# Patient Record
Sex: Male | Born: 1955 | Race: White | Hispanic: No | Marital: Married | Smoking: Former smoker
Health system: Southern US, Community
[De-identification: ages and names within clinical notes are randomized; demographics above are authoritative.]

## PROBLEM LIST (undated history)

## (undated) DIAGNOSIS — G4733 Obstructive sleep apnea (adult) (pediatric): Secondary | ICD-10-CM

## (undated) DIAGNOSIS — K2270 Barrett's esophagus without dysplasia: Secondary | ICD-10-CM

## (undated) DIAGNOSIS — E669 Obesity, unspecified: Secondary | ICD-10-CM

## (undated) DIAGNOSIS — R202 Paresthesia of skin: Secondary | ICD-10-CM

## (undated) DIAGNOSIS — R2681 Unsteadiness on feet: Secondary | ICD-10-CM

## (undated) DIAGNOSIS — K219 Gastro-esophageal reflux disease without esophagitis: Secondary | ICD-10-CM

## (undated) DIAGNOSIS — M069 Rheumatoid arthritis, unspecified: Secondary | ICD-10-CM

## (undated) DIAGNOSIS — Z87891 Personal history of nicotine dependence: Secondary | ICD-10-CM

## (undated) DIAGNOSIS — E538 Deficiency of other specified B group vitamins: Secondary | ICD-10-CM

## (undated) DIAGNOSIS — F329 Major depressive disorder, single episode, unspecified: Secondary | ICD-10-CM

## (undated) DIAGNOSIS — M109 Gout, unspecified: Secondary | ICD-10-CM

## (undated) DIAGNOSIS — H9190 Unspecified hearing loss, unspecified ear: Secondary | ICD-10-CM

## (undated) DIAGNOSIS — F32A Depression, unspecified: Secondary | ICD-10-CM

## (undated) DIAGNOSIS — R251 Tremor, unspecified: Secondary | ICD-10-CM

## (undated) DIAGNOSIS — K760 Fatty (change of) liver, not elsewhere classified: Secondary | ICD-10-CM

## (undated) DIAGNOSIS — I10 Essential (primary) hypertension: Secondary | ICD-10-CM

## (undated) DIAGNOSIS — R200 Anesthesia of skin: Secondary | ICD-10-CM

## (undated) DIAGNOSIS — E119 Type 2 diabetes mellitus without complications: Secondary | ICD-10-CM

## (undated) DIAGNOSIS — E785 Hyperlipidemia, unspecified: Secondary | ICD-10-CM

## (undated) DIAGNOSIS — Z9889 Other specified postprocedural states: Secondary | ICD-10-CM

## (undated) DIAGNOSIS — C61 Malignant neoplasm of prostate: Secondary | ICD-10-CM

## (undated) DIAGNOSIS — R413 Other amnesia: Secondary | ICD-10-CM

## (undated) HISTORY — DX: Other specified postprocedural states: Z98.890

## (undated) HISTORY — DX: Tremor, unspecified: R25.1

## (undated) HISTORY — DX: Rheumatoid arthritis, unspecified: M06.9

## (undated) HISTORY — DX: Major depressive disorder, single episode, unspecified: F32.9

## (undated) HISTORY — DX: Unsteadiness on feet: R26.81

## (undated) HISTORY — DX: Unspecified hearing loss, unspecified ear: H91.90

## (undated) HISTORY — DX: Gout, unspecified: M10.9

## (undated) HISTORY — DX: Paresthesia of skin: R20.2

## (undated) HISTORY — DX: Deficiency of other specified B group vitamins: E53.8

## (undated) HISTORY — DX: Other amnesia: R41.3

## (undated) HISTORY — PX: KNEE SURGERY: SHX244

## (undated) HISTORY — DX: Obstructive sleep apnea (adult) (pediatric): G47.33

## (undated) HISTORY — DX: Type 2 diabetes mellitus without complications: E11.9

## (undated) HISTORY — DX: Malignant neoplasm of prostate: C61

## (undated) HISTORY — DX: Fatty (change of) liver, not elsewhere classified: K76.0

## (undated) HISTORY — DX: Anesthesia of skin: R20.0

## (undated) HISTORY — DX: Barrett's esophagus without dysplasia: K22.70

## (undated) HISTORY — DX: Depression, unspecified: F32.A

---

## 1982-12-19 HISTORY — PX: VASECTOMY: SHX75

## 1999-12-20 HISTORY — PX: UPPER GASTROINTESTINAL ENDOSCOPY: SHX188

## 2012-12-19 DIAGNOSIS — Z9889 Other specified postprocedural states: Secondary | ICD-10-CM

## 2012-12-19 HISTORY — DX: Other specified postprocedural states: Z98.890

## 2013-05-24 ENCOUNTER — Encounter: Payer: Self-pay | Admitting: Cardiovascular Disease

## 2013-05-29 ENCOUNTER — Encounter (HOSPITAL_COMMUNITY): Payer: Self-pay | Admitting: Pharmacy Technician

## 2013-05-29 ENCOUNTER — Other Ambulatory Visit: Payer: Self-pay | Admitting: *Deleted

## 2013-05-29 DIAGNOSIS — Z01818 Encounter for other preprocedural examination: Secondary | ICD-10-CM

## 2013-06-03 NOTE — H&P (Signed)
Chief Complaint: Angina Referring Physician: Dr. Hanley [name]  HPI: The patient is a 57 y/o obese Caucasian male, with a history of hypertension, HLD, and past history of tobacco abuse, who was referred by Dr. Hanley [name], for a left heart catheterization. The patient has been endorsing resting substernal chest pain for the past 3 months. He has a strong family history of CAD. His father suffered 3 MIs. His first was at the age of 74. The patient reports smoking cigars and pipes for 35 years, having quit 5 years ago. He describes his pain as a pressure/squeezing sensation, often radiating to the right arm and bilateral jaws. He notes associated shortness of breath, both resting and exertional. He endorses PND and orthopnea and often has to sleep in a chair, because he "feels smothered". He denies LEE, but notes a persistent cough for the past 3 months. His chest pain episodes often last several minutes to up to one hour. It is also brought on by emotional stress. Occasionally, he has associated nausea and diaphoresis. No vomiting. The pain has been relived with SL NTG and ASA. His last episode of pain was earlier this morning. It was left sided and pressure like, only lasting 5-10 minutes. He reportedly had a heart cath 10 years ago in [name] and it was normal. Dr. Hanley [name] performed an echocardiogram in 04/2013 that demonstrated normal systolic function. EF was 60-65%. No WMAs were noted.   Past Medical History  Diagnosis Date  . Obesity   . HTN (hypertension)   . HLD (hyperlipidemia)   . History of tobacco use   . GERD (gastroesophageal reflux disease)     No past surgical history on file.  Family History  Problem Relation Age of Onset  . Coronary artery disease Father 49    MI x 3  . Hypertension Father    Social History:  has no tobacco, alcohol, and drug history on file.  Allergies:  Allergies  Allergen Reactions  . Bee Venom Anaphylaxis    Medications Prior to Admission  Medication  Sig Dispense Refill  . aspirin EC 81 MG tablet Take 81 mg by mouth daily.      . isosorbide mononitrate (IMDUR) 30 MG 24 hr tablet Take 30 mg by mouth daily.      . simvastatin (ZOCOR) 20 MG tablet Take 20 mg by mouth every evening.        Results for orders placed during the [name] encounter of 06/04/13 (from the past 48 hour(s))  CBC     Status: None   Collection Time    06/04/13  7:48 AM      Result Value Range   WBC 5.4  4.0 - 10.5 K/uL   RBC 4.93  4.22 - 5.81 MIL/uL   Hemoglobin 15.1  13.0 - 17.0 g/dL   HCT 40.9  81.1 - 91.4 %   MCV 86.4  78.0 - 100.0 fL   MCH 30.6  26.0 - 34.0 pg   MCHC 35.4  30.0 - 36.0 g/dL   RDW 78.2  95.6 - 21.3 %   Platelets 183  150 - 400 K/uL   No results found.  Review of Systems  Constitutional: Positive for diaphoresis. Negative for fever, chills, weight loss and malaise/fatigue.  HENT: Negative for sore throat.   Eyes: Negative for blurred vision, double vision and pain.  Respiratory: Positive for cough, sputum production and shortness of breath. Negative for hemoptysis and wheezing.   Cardiovascular: Positive for chest pain, orthopnea and PND.  Negative for palpitations, claudication and leg swelling.  Gastrointestinal: Positive for nausea. Negative for vomiting, abdominal pain, diarrhea, constipation, blood in stool and melena.  Genitourinary: Negative for dysuria, urgency, frequency and hematuria.  Musculoskeletal: Positive for joint pain (jaw pain ). Negative for falls.  Skin: Negative for itching and rash.  Neurological: Positive for headaches (tension headaches and nitro induced). Negative for dizziness, focal weakness, seizures, loss of consciousness and weakness.    Blood pressure 144/80, pulse 74, temperature 97.6 F (36.4 C), temperature source Oral, resp. rate 18, height 5\' 9"  (1.753 m), weight 335 lb (151.955 kg), SpO2 93.00%. Physical Exam  Constitutional: He is oriented to person, place, and time. He appears well-developed and  well-nourished. No distress.  Obese   HENT:  Head: Normocephalic and atraumatic.  Eyes: Conjunctivae and EOM are normal. Pupils are equal, round, and reactive to light.  Neck: Normal range of motion. No JVD present. Carotid bruit is not present. No thyromegaly present.  Cardiovascular: Normal rate, regular rhythm, normal heart sounds and intact distal pulses.  Exam reveals no gallop and no friction rub.   No murmur heard. Pulses:      Radial pulses are 2+ on the right side, and 2+ on the left side.       Dorsalis pedis pulses are 2+ on the right side, and 2+ on the left side.  Respiratory: Effort normal and breath sounds normal. No respiratory distress. He has no wheezes. He has no rales. He exhibits no tenderness.  GI: Soft. Bowel sounds are normal. He exhibits no distension and no mass. There is no tenderness.  obese  Musculoskeletal: He exhibits no edema.  Lymphadenopathy:    He has no cervical adenopathy.  Neurological: He is alert and oriented to person, place, and time.  Skin: Skin is warm and dry. He is not diaphoretic.  Psychiatric: He has a normal mood and affect. His behavior is normal.     Assessment/Plan Principal Problem:   Angina at rest Active Problems:   Obesity   HLD- diet controlled, currently off statin    HTN (hypertension)   History of tobacco use - 35 year history   Family history of early CAD   GERD (gastroesophageal reflux disease)  Plan: 57 y/o male with history of obesity, HTN, HLD, past history of tobacco use and strong family history of  early CAD, presenting for a left heart catheterization, in the setting of resting chest pain, concerning for angina. Exam is benign. INR is WNL at 1.15. HR and BP stable. Plan for diagnostic cath +/- PCI and stenting, with Dr. Ally[name].  Brandon Butcher, PA-C 06/04/2013, 8:24 AM

## 2013-06-04 ENCOUNTER — Ambulatory Visit (HOSPITAL_COMMUNITY)
Admission: RE | Admit: 2013-06-04 | Discharge: 2013-06-04 | Disposition: A | Discharge status: Home / Self Care | Payer: 59 | Source: Ambulatory Visit | Attending: Cardiovascular Disease | Admitting: Cardiovascular Disease

## 2013-06-04 ENCOUNTER — Encounter (HOSPITAL_COMMUNITY)
Admission: RE | Disposition: A | Discharge status: Home / Self Care | Payer: Self-pay | Source: Ambulatory Visit | Attending: Cardiovascular Disease

## 2013-06-04 ENCOUNTER — Encounter (HOSPITAL_COMMUNITY): Payer: Self-pay | Admitting: Cardiology

## 2013-06-04 DIAGNOSIS — K219 Gastro-esophageal reflux disease without esophagitis: Secondary | ICD-10-CM | POA: Insufficient documentation

## 2013-06-04 DIAGNOSIS — R9430 Abnormal result of cardiovascular function study, unspecified: Secondary | ICD-10-CM

## 2013-06-04 DIAGNOSIS — R0989 Other specified symptoms and signs involving the circulatory and respiratory systems: Secondary | ICD-10-CM | POA: Insufficient documentation

## 2013-06-04 DIAGNOSIS — Z91038 Other insect allergy status: Secondary | ICD-10-CM | POA: Insufficient documentation

## 2013-06-04 DIAGNOSIS — I208 Other forms of angina pectoris: Secondary | ICD-10-CM

## 2013-06-04 DIAGNOSIS — R0609 Other forms of dyspnea: Secondary | ICD-10-CM | POA: Insufficient documentation

## 2013-06-04 DIAGNOSIS — E669 Obesity, unspecified: Secondary | ICD-10-CM | POA: Insufficient documentation

## 2013-06-04 DIAGNOSIS — Z87891 Personal history of nicotine dependence: Secondary | ICD-10-CM | POA: Insufficient documentation

## 2013-06-04 DIAGNOSIS — Z79899 Other long term (current) drug therapy: Secondary | ICD-10-CM | POA: Insufficient documentation

## 2013-06-04 DIAGNOSIS — Z7982 Long term (current) use of aspirin: Secondary | ICD-10-CM | POA: Insufficient documentation

## 2013-06-04 DIAGNOSIS — E785 Hyperlipidemia, unspecified: Secondary | ICD-10-CM | POA: Insufficient documentation

## 2013-06-04 DIAGNOSIS — Z01818 Encounter for other preprocedural examination: Secondary | ICD-10-CM

## 2013-06-04 DIAGNOSIS — Z6841 Body Mass Index (BMI) 40.0 and over, adult: Secondary | ICD-10-CM | POA: Insufficient documentation

## 2013-06-04 DIAGNOSIS — I10 Essential (primary) hypertension: Secondary | ICD-10-CM | POA: Insufficient documentation

## 2013-06-04 DIAGNOSIS — R0789 Other chest pain: Secondary | ICD-10-CM | POA: Insufficient documentation

## 2013-06-04 DIAGNOSIS — Z8249 Family history of ischemic heart disease and other diseases of the circulatory system: Secondary | ICD-10-CM

## 2013-06-04 HISTORY — DX: Obesity, unspecified: E66.9

## 2013-06-04 HISTORY — DX: Hyperlipidemia, unspecified: E78.5

## 2013-06-04 HISTORY — DX: Gastro-esophageal reflux disease without esophagitis: K21.9

## 2013-06-04 HISTORY — DX: Personal history of nicotine dependence: Z87.891

## 2013-06-04 HISTORY — PX: LEFT HEART CATHETERIZATION WITH CORONARY ANGIOGRAM: SHX5451

## 2013-06-04 HISTORY — DX: Essential (primary) hypertension: I10

## 2013-06-04 LAB — BASIC METABOLIC PANEL
CO2: 24 mEq/L (ref 19–32)
Chloride: 104 mEq/L (ref 96–112)
GFR calc Af Amer: >90 mL/min (ref >90)
Potassium: 3.9 mEq/L (ref 3.5–5.1)
Sodium: 139 mEq/L (ref 135–145)

## 2013-06-04 LAB — CBC
HCT: 42.6 % (ref 39.0–52.0)
Hemoglobin: 15.1 g/dL (ref 13.0–17.0)
RBC: 4.93 MIL/uL (ref 4.22–5.81)
RDW: 13.1 % (ref 11.5–15.5)
WBC: 5.4 10*3/uL (ref 4.0–10.5)

## 2013-06-04 LAB — PROTIME-INR: INR: 1.15 (ref 0.00–1.49)

## 2013-06-04 SURGERY — LEFT HEART CATHETERIZATION WITH CORONARY ANGIOGRAM
Anesthesia: Moderate Sedation

## 2013-06-04 MED ORDER — DIAZEPAM 5 MG PO TABS
5.0000 mg | ORAL_TABLET | ORAL | Status: AC
  Administered 2013-06-04: 5 mg via ORAL
  Filled 2013-06-04: qty 1

## 2013-06-04 MED ORDER — HEPARIN (PORCINE) IN NACL 2-0.9 UNIT/ML-% IJ SOLN
INTRAMUSCULAR | Status: AC
  Filled 2013-06-04: qty 1000

## 2013-06-04 MED ORDER — ASPIRIN 81 MG PO CHEW
324.0000 mg | CHEWABLE_TABLET | ORAL | Status: AC
  Administered 2013-06-04: 324 mg via ORAL
  Filled 2013-06-04: qty 4

## 2013-06-04 MED ORDER — ACETAMINOPHEN 325 MG PO TABS: 650.0000 mg | ORAL_TABLET | ORAL | Status: DC | PRN

## 2013-06-04 MED ORDER — SODIUM CHLORIDE 0.9 % IJ SOLN: 3.0000 mL | INTRAMUSCULAR | Status: DC | PRN

## 2013-06-04 MED ORDER — VERAPAMIL HCL 2.5 MG/ML IV SOLN
INTRAVENOUS | Status: AC
  Filled 2013-06-04: qty 2

## 2013-06-04 MED ORDER — SODIUM CHLORIDE 0.9 % IV SOLN
INTRAVENOUS | Status: DC
  Administered 2013-06-04: 08:00:00 via INTRAVENOUS

## 2013-06-04 MED ORDER — NITROGLYCERIN 0.2 MG/ML ON CALL CATH LAB
INTRAVENOUS | Status: AC
  Filled 2013-06-04: qty 1

## 2013-06-04 MED ORDER — HEPARIN SODIUM (PORCINE) 1000 UNIT/ML IJ SOLN
INTRAMUSCULAR | Status: AC
  Filled 2013-06-04: qty 1

## 2013-06-04 MED ORDER — ONDANSETRON HCL 4 MG/2ML IJ SOLN: 4.0000 mg | Freq: Four times a day (QID) | INTRAMUSCULAR | Status: DC | PRN

## 2013-06-04 MED ORDER — LIDOCAINE HCL (PF) 1 % IJ SOLN
INTRAMUSCULAR | Status: AC
  Filled 2013-06-04: qty 30

## 2013-06-04 MED ORDER — SODIUM CHLORIDE 0.9 % IV SOLN: INTRAVENOUS | Status: AC

## 2013-06-04 NOTE — CV Procedure (Signed)
[name] is a 56 y.o. male    [id number] LOCATION:  FACILITY: [facility]  PHYSICIAN: [name], M.D. 1956/09/16   DATE OF PROCEDURE:  06/04/2013  DATE OF DISCHARGE:   CARDIAC CATHETERIZATION     History obtained from chart review.The patient is a 57 y/o obese Caucasian male, with a history of hypertension, HLD, and past history of tobacco abuse, who was referred by [name], for a left heart catheterization. The patient has been endorsing resting substernal chest pain for the past 3 months. He has a strong family history of CAD. His father suffered 3 MIs. His first was at the age of 80. The patient reports smoking cigars and pipes for 35 years, having quit 5 years ago. He describes his pain as a pressure/squeezing sensation, often radiating to the right arm and bilateral jaws. He notes associated shortness of breath, both resting and exertional. He endorses PND and orthopnea and often has to sleep in a chair, because he "feels smothered". He denies LEE, but notes a persistent cough for the past 3 months. His chest pain episodes often last several minutes to up to one hour. It is also brought on by emotional stress. Occasionally, he has associated nausea and diaphoresis. No vomiting. The pain has been relived with SL NTG and ASA. His last episode of pain was earlier this morning. It was left sided and pressure like, only lasting 5-10 minutes. He reportedly had a heart cath 10 years ago in [facility] and it was normal. [name] performed an echocardiogram in 04/2013 that demonstrated normal systolic function. EF was 60-65%. No WMAs were noted. A Myoview stress test revealed mild inferior ischemia. Based on this cardiac catheterization was recommended.    PROCEDURE DESCRIPTION:    The patient was brought to the second floor [facility] [facility] cath lab in the postabsorptive state. He was premedicated with Valium 5 mg by mouth. His right wrist was prepped and shaved in usual sterile  fashion. Xylocaine 1% was used for local anesthesia. A 6 French sheath was inserted into the right radial  artery using standard Seldinger technique. The patient received 6000 units  of heparin  intravenously.  5 [place] TIG catheter, JR 4 catheter and pigtail catheters were used for selective coronary angiography and left ventriculography respectively. Visipaque dye was used for the entirety of the case. Retrograde aortic, left ventricular and pullback pressures were recorded.    HEMODYNAMICS:    AO SYSTOLIC/AO DIASTOLIC: 128/86   LV SYSTOLIC/LV DIASTOLIC: 133/16  ANGIOGRAPHIC RESULTS:   1. Left main; normal  2. LAD; normal 3. Left circumflex; normal.  4. Right coronary artery; dominant and normal 5. Left ventriculography; RAO left ventriculogram was performed using  25 mL of Visipaque dye at 12 mL/second. The overall LVEF estimated  60 %  Without wall motion abnormalities  IMPRESSION:[name] has normal coronary arteries and normal left ventricular function. I believe his chest pain is noncardiac and his dyspnea probably relates to his thighs and deconditioning. The sheath was removed and a TR band was placed on the right wrist to achieve patent hemostasis. The patient left the Cath Lab in stable condition. He'll be hydrated for 2 hours and then discharged home. Dr. Andris [name] was notified of these results.  Brandon Gess MD, [facility] 06/04/2013 9:57 AM

## 2013-06-04 NOTE — H&P (Signed)
.    Pt was reexamined and existing H & P reviewed. No changes found.  [name], MD [facility] 06/04/2013 9:21 AM

## 2013-06-04 NOTE — Progress Notes (Addendum)
Radial cath discharge & medication discharge instructions given to patient and spouse.  Both voice understanding.   Positive reverse allen's test right radial

## 2014-11-27 ENCOUNTER — Encounter (HOSPITAL_COMMUNITY): Payer: Self-pay | Admitting: Cardiovascular Disease

## 2016-03-02 DIAGNOSIS — E1140 Type 2 diabetes mellitus with diabetic neuropathy, unspecified: Secondary | ICD-10-CM | POA: Insufficient documentation

## 2016-03-02 DIAGNOSIS — F329 Major depressive disorder, single episode, unspecified: Secondary | ICD-10-CM | POA: Insufficient documentation

## 2016-03-02 DIAGNOSIS — Z91199 Patient's noncompliance with other medical treatment and regimen due to unspecified reason: Secondary | ICD-10-CM | POA: Insufficient documentation

## 2016-03-02 DIAGNOSIS — E7800 Pure hypercholesterolemia, unspecified: Secondary | ICD-10-CM | POA: Insufficient documentation

## 2016-03-02 DIAGNOSIS — I10 Essential (primary) hypertension: Secondary | ICD-10-CM | POA: Insufficient documentation

## 2016-03-02 DIAGNOSIS — M069 Rheumatoid arthritis, unspecified: Secondary | ICD-10-CM | POA: Insufficient documentation

## 2016-08-17 DIAGNOSIS — R1011 Right upper quadrant pain: Secondary | ICD-10-CM | POA: Insufficient documentation

## 2017-09-13 DIAGNOSIS — N401 Enlarged prostate with lower urinary tract symptoms: Secondary | ICD-10-CM | POA: Insufficient documentation

## 2017-09-13 DIAGNOSIS — G4733 Obstructive sleep apnea (adult) (pediatric): Secondary | ICD-10-CM | POA: Insufficient documentation

## 2018-03-27 DIAGNOSIS — N138 Other obstructive and reflux uropathy: Secondary | ICD-10-CM | POA: Diagnosis not present

## 2018-03-27 DIAGNOSIS — N401 Enlarged prostate with lower urinary tract symptoms: Secondary | ICD-10-CM | POA: Diagnosis not present

## 2018-03-27 DIAGNOSIS — R358 Other polyuria: Secondary | ICD-10-CM | POA: Diagnosis not present

## 2018-04-24 DIAGNOSIS — N401 Enlarged prostate with lower urinary tract symptoms: Secondary | ICD-10-CM | POA: Diagnosis not present

## 2018-04-24 DIAGNOSIS — N138 Other obstructive and reflux uropathy: Secondary | ICD-10-CM | POA: Diagnosis not present

## 2018-05-08 DIAGNOSIS — N138 Other obstructive and reflux uropathy: Secondary | ICD-10-CM | POA: Diagnosis not present

## 2018-05-08 DIAGNOSIS — N401 Enlarged prostate with lower urinary tract symptoms: Secondary | ICD-10-CM | POA: Diagnosis not present

## 2018-05-17 DIAGNOSIS — N138 Other obstructive and reflux uropathy: Secondary | ICD-10-CM | POA: Diagnosis not present

## 2018-05-17 DIAGNOSIS — N401 Enlarged prostate with lower urinary tract symptoms: Secondary | ICD-10-CM | POA: Diagnosis not present

## 2018-05-21 DIAGNOSIS — N400 Enlarged prostate without lower urinary tract symptoms: Secondary | ICD-10-CM | POA: Diagnosis not present

## 2018-05-21 DIAGNOSIS — Z01818 Encounter for other preprocedural examination: Secondary | ICD-10-CM | POA: Diagnosis not present

## 2018-05-23 DIAGNOSIS — Z8042 Family history of malignant neoplasm of prostate: Secondary | ICD-10-CM | POA: Diagnosis not present

## 2018-05-23 DIAGNOSIS — C61 Malignant neoplasm of prostate: Secondary | ICD-10-CM | POA: Diagnosis not present

## 2018-05-23 DIAGNOSIS — N401 Enlarged prostate with lower urinary tract symptoms: Secondary | ICD-10-CM | POA: Diagnosis not present

## 2018-05-23 DIAGNOSIS — N138 Other obstructive and reflux uropathy: Secondary | ICD-10-CM | POA: Diagnosis not present

## 2018-05-23 DIAGNOSIS — R319 Hematuria, unspecified: Secondary | ICD-10-CM | POA: Diagnosis not present

## 2018-05-23 HISTORY — PX: TRANSURETHRAL RESECTION OF PROSTATE: SHX73

## 2018-05-24 DIAGNOSIS — N401 Enlarged prostate with lower urinary tract symptoms: Secondary | ICD-10-CM | POA: Diagnosis not present

## 2018-05-24 DIAGNOSIS — N138 Other obstructive and reflux uropathy: Secondary | ICD-10-CM | POA: Diagnosis not present

## 2018-05-24 DIAGNOSIS — Z8042 Family history of malignant neoplasm of prostate: Secondary | ICD-10-CM | POA: Diagnosis not present

## 2018-05-25 DIAGNOSIS — N401 Enlarged prostate with lower urinary tract symptoms: Secondary | ICD-10-CM | POA: Diagnosis not present

## 2018-05-25 DIAGNOSIS — N138 Other obstructive and reflux uropathy: Secondary | ICD-10-CM | POA: Diagnosis not present

## 2018-05-29 DIAGNOSIS — C61 Malignant neoplasm of prostate: Secondary | ICD-10-CM | POA: Diagnosis not present

## 2018-06-19 DIAGNOSIS — C61 Malignant neoplasm of prostate: Secondary | ICD-10-CM | POA: Diagnosis not present

## 2018-08-08 DIAGNOSIS — R319 Hematuria, unspecified: Secondary | ICD-10-CM | POA: Diagnosis not present

## 2018-08-08 DIAGNOSIS — N390 Urinary tract infection, site not specified: Secondary | ICD-10-CM | POA: Diagnosis not present

## 2018-08-08 DIAGNOSIS — R300 Dysuria: Secondary | ICD-10-CM | POA: Diagnosis not present

## 2018-09-03 DIAGNOSIS — R300 Dysuria: Secondary | ICD-10-CM | POA: Diagnosis not present

## 2018-09-03 DIAGNOSIS — C61 Malignant neoplasm of prostate: Secondary | ICD-10-CM | POA: Diagnosis not present

## 2018-09-03 DIAGNOSIS — R339 Retention of urine, unspecified: Secondary | ICD-10-CM | POA: Diagnosis not present

## 2018-09-13 DIAGNOSIS — C61 Malignant neoplasm of prostate: Secondary | ICD-10-CM | POA: Diagnosis not present

## 2018-09-17 DIAGNOSIS — E700 Classical phenylketonuria: Secondary | ICD-10-CM | POA: Diagnosis not present

## 2018-09-17 DIAGNOSIS — R351 Nocturia: Secondary | ICD-10-CM | POA: Diagnosis not present

## 2018-09-17 DIAGNOSIS — N401 Enlarged prostate with lower urinary tract symptoms: Secondary | ICD-10-CM | POA: Diagnosis not present

## 2018-09-17 DIAGNOSIS — E1100 Type 2 diabetes mellitus with hyperosmolarity without nonketotic hyperglycemic-hyperosmolar coma (NKHHC): Secondary | ICD-10-CM | POA: Diagnosis not present

## 2018-09-17 DIAGNOSIS — I10 Essential (primary) hypertension: Secondary | ICD-10-CM | POA: Diagnosis not present

## 2018-09-19 DIAGNOSIS — C61 Malignant neoplasm of prostate: Secondary | ICD-10-CM | POA: Diagnosis not present

## 2018-09-20 DIAGNOSIS — E1100 Type 2 diabetes mellitus with hyperosmolarity without nonketotic hyperglycemic-hyperosmolar coma (NKHHC): Secondary | ICD-10-CM | POA: Diagnosis not present

## 2018-09-20 DIAGNOSIS — R1011 Right upper quadrant pain: Secondary | ICD-10-CM | POA: Diagnosis not present

## 2018-09-20 DIAGNOSIS — Z0001 Encounter for general adult medical examination with abnormal findings: Secondary | ICD-10-CM | POA: Diagnosis not present

## 2018-09-20 DIAGNOSIS — I10 Essential (primary) hypertension: Secondary | ICD-10-CM | POA: Diagnosis not present

## 2018-09-20 DIAGNOSIS — E7800 Pure hypercholesterolemia, unspecified: Secondary | ICD-10-CM | POA: Diagnosis not present

## 2018-10-03 DIAGNOSIS — K8020 Calculus of gallbladder without cholecystitis without obstruction: Secondary | ICD-10-CM | POA: Diagnosis not present

## 2018-10-03 DIAGNOSIS — R1011 Right upper quadrant pain: Secondary | ICD-10-CM | POA: Diagnosis not present

## 2018-10-03 DIAGNOSIS — K760 Fatty (change of) liver, not elsewhere classified: Secondary | ICD-10-CM | POA: Diagnosis not present

## 2018-11-27 DIAGNOSIS — R413 Other amnesia: Secondary | ICD-10-CM | POA: Diagnosis not present

## 2018-11-27 DIAGNOSIS — R2681 Unsteadiness on feet: Secondary | ICD-10-CM | POA: Insufficient documentation

## 2018-11-27 DIAGNOSIS — R202 Paresthesia of skin: Secondary | ICD-10-CM | POA: Insufficient documentation

## 2018-11-27 DIAGNOSIS — G250 Essential tremor: Secondary | ICD-10-CM | POA: Diagnosis not present

## 2018-11-28 DIAGNOSIS — E538 Deficiency of other specified B group vitamins: Secondary | ICD-10-CM | POA: Insufficient documentation

## 2018-12-02 DIAGNOSIS — R2681 Unsteadiness on feet: Secondary | ICD-10-CM | POA: Diagnosis not present

## 2018-12-02 DIAGNOSIS — R413 Other amnesia: Secondary | ICD-10-CM | POA: Diagnosis not present

## 2018-12-03 DIAGNOSIS — C61 Malignant neoplasm of prostate: Secondary | ICD-10-CM | POA: Diagnosis not present

## 2018-12-05 DIAGNOSIS — C61 Malignant neoplasm of prostate: Secondary | ICD-10-CM | POA: Diagnosis not present

## 2018-12-07 DIAGNOSIS — E1100 Type 2 diabetes mellitus with hyperosmolarity without nonketotic hyperglycemic-hyperosmolar coma (NKHHC): Secondary | ICD-10-CM | POA: Diagnosis not present

## 2018-12-07 LAB — HEMOGLOBIN A1C: Hemoglobin A1C: 8.1

## 2018-12-07 LAB — BASIC METABOLIC PANEL
BUN: 16 (ref 4–21)
Creatinine: 0.9 (ref 0.6–1.3)
GLUCOSE: 243
Potassium: 4.2 (ref 3.4–5.3)
Sodium: 136 — AB (ref 137–147)

## 2018-12-07 LAB — HEPATIC FUNCTION PANEL
ALT: 22 (ref 10–40)
AST: 16 (ref 14–40)
Alkaline Phosphatase: 70 (ref 25–125)
BILIRUBIN, TOTAL: 0.6

## 2018-12-07 LAB — VITAMIN B12: Vitamin B-12: 196

## 2018-12-10 DIAGNOSIS — E1100 Type 2 diabetes mellitus with hyperosmolarity without nonketotic hyperglycemic-hyperosmolar coma (NKHHC): Secondary | ICD-10-CM | POA: Diagnosis not present

## 2018-12-10 DIAGNOSIS — I10 Essential (primary) hypertension: Secondary | ICD-10-CM | POA: Diagnosis not present

## 2018-12-10 DIAGNOSIS — Z20828 Contact with and (suspected) exposure to other viral communicable diseases: Secondary | ICD-10-CM | POA: Diagnosis not present

## 2019-01-01 DIAGNOSIS — R200 Anesthesia of skin: Secondary | ICD-10-CM | POA: Diagnosis not present

## 2019-01-01 DIAGNOSIS — R413 Other amnesia: Secondary | ICD-10-CM | POA: Diagnosis not present

## 2019-01-01 DIAGNOSIS — E538 Deficiency of other specified B group vitamins: Secondary | ICD-10-CM | POA: Diagnosis not present

## 2019-01-01 DIAGNOSIS — G250 Essential tremor: Secondary | ICD-10-CM | POA: Diagnosis not present

## 2019-01-09 DIAGNOSIS — J0100 Acute maxillary sinusitis, unspecified: Secondary | ICD-10-CM | POA: Diagnosis not present

## 2019-01-09 DIAGNOSIS — Z87891 Personal history of nicotine dependence: Secondary | ICD-10-CM | POA: Diagnosis not present

## 2019-01-28 ENCOUNTER — Encounter: Payer: Self-pay | Admitting: Internal Medicine

## 2019-01-28 ENCOUNTER — Ambulatory Visit: Payer: BLUE CROSS/BLUE SHIELD | Admitting: Internal Medicine

## 2019-01-28 VITALS — BP 120/78 | HR 81 | Temp 98.6°F | Ht 68.75 in | Wt 335.0 lb

## 2019-01-28 DIAGNOSIS — E782 Mixed hyperlipidemia: Secondary | ICD-10-CM

## 2019-01-28 DIAGNOSIS — R413 Other amnesia: Secondary | ICD-10-CM

## 2019-01-28 DIAGNOSIS — R440 Auditory hallucinations: Secondary | ICD-10-CM

## 2019-01-28 DIAGNOSIS — R296 Repeated falls: Secondary | ICD-10-CM | POA: Diagnosis not present

## 2019-01-28 DIAGNOSIS — K2100 Gastro-esophageal reflux disease with esophagitis, without bleeding: Secondary | ICD-10-CM

## 2019-01-28 DIAGNOSIS — E1140 Type 2 diabetes mellitus with diabetic neuropathy, unspecified: Secondary | ICD-10-CM

## 2019-01-28 DIAGNOSIS — K22719 Barrett's esophagus with dysplasia, unspecified: Secondary | ICD-10-CM

## 2019-01-28 DIAGNOSIS — R251 Tremor, unspecified: Secondary | ICD-10-CM

## 2019-01-28 DIAGNOSIS — F321 Major depressive disorder, single episode, moderate: Secondary | ICD-10-CM | POA: Diagnosis not present

## 2019-01-28 DIAGNOSIS — H833X3 Noise effects on inner ear, bilateral: Secondary | ICD-10-CM

## 2019-01-28 DIAGNOSIS — I10 Essential (primary) hypertension: Secondary | ICD-10-CM

## 2019-01-28 MED ORDER — BUPROPION HCL ER (XL) 150 MG PO TB24: 150.0000 mg | ORAL_TABLET | Freq: Every day | ORAL | 3 refills | Status: DC

## 2019-01-28 NOTE — Progress Notes (Signed)
Provider:  Rexene Edison. Mariea Clonts, D.O., C.M.D. Location:   [facility]   Place of Service:   clinic  Previous PCP: Guadlupe Spanish, MD Patient Care Team: Guadlupe Spanish, MD as PCP - General (Internal Medicine)  Extended Emergency Contact Information Primary Emergency Contact: Laningham,Sharon Address: [address]          [place], [place] [address] Johnnette Litter of [place] Phone: [phone] Mobile Phone: [phone] Relation: Spouse  Goals of Care: Advanced Directive information Advanced Directives 01/28/2019  Does Patient Have a Medical Advance Directive? Yes  Does patient want to make changes to medical advance directive? No - Patient declined  Pre-existing out of facility DNR order (yellow form or pink MOST form) -  Do not have HCPOA in writing.  Chief Complaint  Patient presents with  . Establish Care    new patient    HPI: Patient is a 63 y.o. male seen today to establish with [facility].  Records in care everywhere.  Labs to be abstracted out of care everywhere.    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  Gained weight.  His wife says probably 12 years.  Normal today at 120/78.  Has been at goal the last 7 appts or so.    Diabetes:  Diagnosed with it in 2015.  Had been wellcontrolled with metformin.  trulicity then helped.  Metformin reduced when he had gi upset, but increased again.  Also on glipizide.  He doesn't check his sugar.  His wife reports he's noncompliant with checking.  It runs in the 130s typically.  Hba1c was 8.1 in December.  He gets sleepy if sugar is high.   Overall, he sleeps quite a bit.  He does ot have energy or enthusiasm to do much.  He ays he used to read a lot.  Has a [employer] of books.  Used to watch movies.  Has pulled out of civic clubs.  Doesn't even watch tv anymore.  Likes IT consultant.  He's become socially withdrawn over the past two years.  He used to do a lot of public speaking.  It's become hard to pull  words.  Used to be able to use words people can't spell.  He's having to read things twice to remember.  Memory is giving him problems.  Says he'll get misdirected.  His wife does most of the dirivng.  He feels like he's not analytical much anymore.  What bothers him the most in the past year is lack of interest in things he used to enjoy.  He likes being home with his three dogs.  He enjoyed teaching grandsons things in years passed.  Says it's probably his memory that bothers him the most.  He watches the news 24x7.    He has hearing aids--bad from shooting guns his whole life.  Sometimes just does not want to be around people and make conversation.  Has friends but doesn't want to reach out to them.    Misses work, but not enough to want to go back.  Worked for [employer] for a few years.  Money is now moved by wire.    Used to like working in his yard.  Does not get the enjoyment he had out of this anymore.  Many friends have died.  Has lost connections there.  Goes to bed at 7:30pm and sleeps until 8am the next morning.  Remembers things really well from childhood.  Dreams a lot.  Usually dreams about being at work.  All of those in his dreams are deceased.  Sometimes dreams about being a kid again.  Can name names from the [place] from 40 yrs ago.    Gets a headache in either temple often.  Takes goody's powder for it.  It feels like lightning bolts.  He quit going to church.  He questions things a lot.  He looks up at the stars to see if the cathedral lights are on.  He's fed up with the evilness of the world.  Sometimes thinks the world needs an asteroid to hit it and kill off all the people and keep the animals.  He talks about death a lot.  He is not afraid of death.  Has seen suicides, car collisions, etec.  The pain people suffer duirng a transiition from life to death he does not want to go through.  Says when he was diagnosed with cancer, his attitude changed to  good bad it's just the same. He says he has never thought of suicide.  He wants to live.  He wants to see Jarrett Soho hopefully have a granddaughter.  His religious convictions also prevent this.  Says those who kill themselves are selfish and he would not have those thoughts.  He says he was put on an SSRI when their grandson was living with them.  That was when his depression peaked.  He thinks the celexa helped him some.  It was started in 2017 or 2018.  It was increased last year.    Prostate cancer--dxed in June--had TURP with biopsy:  His dad died of prostate cancer with bone mets.  He remembers what he went through which was traumatic.  It felt like a death sentence.  He thought he'd go out with a heart attack not that.  His enlarged prostate was closing on the urethra.  Gleason 6 prostate ca.  Radiation or wait and see were choices.   Has tremors pretty bad at times, more at night.  It's getting worse when he's eating making it challenging to get his food from his plate to his lips.    He has fallen numerous times and his wife and daughter bother notice he shuffles his feet.  They've tried to help with this by purchasing new shoewear which has made him sometimes pick up his feet a bit better.      MRI showed "multiple vascular strokes on it" was what they were told though it showed chronic ischemic disease.      He hears voices of dead.  He saw a face on a plate that he thought was talking to him.  Later in the discussion, his wife mentioned that he sometimes will see people out of the corner of his eye passing by him.  He also hears the voices of people, but she wonders if it isn't that he isn't clearly hearing actual noises.    He's paid a couple of bills twice.  He has forgetten his medications of double takes it.  His wife has been giving him a day of the week pillbox.      He is no longer so authoritative.  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    57/32 in Jan 28/30 prior to that  in Sept.  Past Medical History:  Diagnosis Date  . B12 deficiency    Per New Patient Packet   . Barrett esophagus    Per New Patient Packet   .  Depression    Per New Patient Packet   . Diabetes [facility])    Per New Patient Packet   . Fatty liver    Per New Patient Packet   . GERD (gastroesophageal reflux disease)   . Gout    Per New Patient Packet   . History of cardiac cath 2014   Dr.Berry, Per New Patient Packet   . History of esophagogastroduodenoscopy (EGD)    Per New Patient Packet   . History of tobacco use   . HLD (hyperlipidemia)   . HOH (hard of hearing)    Per New Patient Packet   . HTN (hypertension)   . Memory loss    Per New Patient Packet   . Numbness and tingling of both feet    Per New Patient Packet   . Obesity   . OSA (obstructive sleep apnea)    Per New Patient Packet, No CPAP  . Prostate cancer [facility])    Per New Patient Packet   . Rheumatoid arthritis [facility])    Per New Patient Packet   . Tremor    Per New Patient Packet   . Unsteady gait    Per New Patient Packet    Past Surgical History:  Procedure Laterality Date  . KNEE SURGERY Right    Per New Patient Packet   . LEFT HEART CATHETERIZATION WITH CORONARY ANGIOGRAM N/A 06/04/2013   Procedure: LEFT HEART CATHETERIZATION WITH CORONARY ANGIOGRAM;  Surgeon: Lorretta Harp, MD;  Location: [facility] CATH LAB;  Service: Cardiovascular;  Laterality: N/A;  . TRANSURETHRAL RESECTION OF PROSTATE  05/23/2018   Per New Patient Packet, [place]   . UPPER GASTROINTESTINAL ENDOSCOPY  2001   Per New Patient Packet   . VASECTOMY  1984   Per New Patient Packet     Social History   Socioeconomic History  . Marital status: Married    Spouse name: Not on file  . Number of children: Not on file  . Years of education: Not on file  . Highest education level: Not on file  Occupational History  . Not on file  Social Needs  . Financial resource strain: Not on file  . Food insecurity:    Worry: Not on file     Inability: Not on file  . Transportation needs:    Medical: Not on file    Non-medical: Not on file  Tobacco Use  . Smoking status: Former Smoker    Types: Cigarettes, Pipe    Last attempt to quit: 12/19/2013    Years since quitting: 5.1  . Smokeless tobacco: Never Used  Substance and Sexual Activity  . Alcohol use: Yes    Comment: Occasional   . Drug use: Never  . Sexual activity: Not on file  Lifestyle  . Physical activity:    Days per week: Not on file    Minutes per session: Not on file  . Stress: Not on file  Relationships  . Social connections:    Talks on phone: Not on file    Gets together: Not on file    Attends religious service: Not on file    Active member of club or organization: Not on file    Attends meetings of clubs or organizations: Not on file    Relationship status: Not on file  Other Topics Concern  . 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 No, putting on socks are difficult   Do you have difficulty preparing food or eating? No   Do you have difficulty managing your medications? Yes   Do you have difficulty managing your finances? Yes, recently    Do you have difficulty affording your medications? No    reports that he quit smoking about 5 years ago. His smoking use included cigarettes and pipe. He has never used smokeless tobacco. He reports current alcohol use. He reports that he does not use drugs.  Functional Status Survey:    Family History  Problem Relation Age of Onset  . Coronary artery  disease Father 17       MI x 3  . Hypertension Father   . Prostate cancer Father   . Hypertension Mother   . Dementia Mother   . Rheum arthritis Mother   . Atrial fibrillation Mother   . Hypothyroidism Mother   . Anxiety disorder Mother   . Depression Mother   . Hepatitis C Mother   . Hypertension Brother   . High Cholesterol Brother   . Alcohol abuse Brother   . Obesity Brother   . Anxiety disorder Daughter   . Conductive hearing loss Daughter   . High Cholesterol Daughter   . Vitamin D deficiency Daughter   . Bipolar disorder Daughter   . Depression Daughter   . Obesity Daughter     Health Maintenance  Topic Date Due  . Hepatitis C Screening  1956/10/26  . HIV Screening  12/15/1971  . COLONOSCOPY  12/14/2006  . INFLUENZA VACCINE  07/19/2018  . TETANUS/TDAP  09/18/2024    Allergies  Allergen Reactions  . Bee Venom Anaphylaxis    Outpatient Encounter Medications as of 01/28/2019  Medication Sig  . allopurinol (ZYLOPRIM) 300 MG tablet Take 300 mg by mouth daily.  . Aspirin-Acetaminophen-Caffeine (GOODYS EXTRA STRENGTH) [phone] MG PACK Take by mouth as needed.  Marland Kitchen atorvastatin (LIPITOR) 40 MG tablet Take 40 mg by mouth daily.  Marland Kitchen dexlansoprazole (DEXILANT) 60 MG capsule Take 60 mg by mouth daily as needed.  . Dulaglutide (TRULICITY) 1.5 WC/5.8NI SOPN Inject 1.5 mg into the skin every 7 (seven) days.  Marland Kitchen glipiZIDE (GLUCOTROL XL) 5 MG 24 hr tablet Take 5 mg by mouth daily with breakfast.  . lisinopril (PRINIVIL,ZESTRIL) 20 MG tablet Take 20 mg by mouth daily.  . metFORMIN (GLUCOPHAGE) 1000 MG tablet Take 1,000 mg by mouth 2 (two) times daily with a meal.  . nitroGLYCERIN (NITROSTAT) 0.4 MG SL tablet Place 0.4 mg under the tongue every 5 (five) minutes as needed for chest pain.  . pantoprazole (PROTONIX) 40 MG tablet Take 40 mg by mouth daily as needed.  . Turmeric Curcumin 500 MG CAPS Take by mouth daily.  . vitamin B-12 (CYANOCOBALAMIN) 1000 MCG tablet Take 1,000 mcg  by mouth every other day.  . [DISCONTINUED] citalopram (CELEXA) 40 MG tablet Take 40 mg by mouth daily.  Marland Kitchen buPROPion (WELLBUTRIN XL) 150 MG 24 hr  tablet Take 1 tablet (150 mg total) by mouth daily.  . [DISCONTINUED] Aspirin-Acetaminophen-Caffeine [phone] MG PACK Take by mouth as needed.   No facility-administered encounter medications on file as of 01/28/2019.     Review of Systems  Constitutional: Positive for malaise/fatigue. Negative for chills, fever and weight loss.       Sits around a lot, does not do what he used to enjoy  HENT: Positive for hearing loss. Negative for congestion and ear pain.   Eyes: Negative for blurred vision and double vision.       Glasses, had recent eye checkup  Respiratory: Negative for cough and shortness of breath.   Cardiovascular: Negative for chest pain, palpitations, orthopnea, claudication, leg swelling and PND.  Gastrointestinal: Positive for heartburn. Negative for abdominal pain, blood in stool, constipation, diarrhea, melena, nausea and vomiting.       Barrett's esophagus  Genitourinary: Negative for dysuria.       Has had some difficulty with leakage since his prostate ca txs  Musculoskeletal: Positive for falls and joint pain. Negative for back pain, myalgias and neck pain.       RA diagnosis by bloodwork per his daughter--gets inflammation of mcp joints  Skin: Negative for itching and rash.  Neurological: Positive for tingling, sensory change and weakness. Negative for dizziness, focal weakness, seizures and loss of consciousness.       Speaks slower and harder to find words--was eloquent speaker and leader in the past per pt himself and his family  Endo/Heme/Allergies: Does not bruise/bleed easily.  Psychiatric/Behavioral: Positive for depression, hallucinations and memory loss. Negative for substance abuse and suicidal ideas. The patient is not nervous/anxious and does not have insomnia.     Vitals:   01/28/19 1520  BP: 120/78  Pulse:  81  Temp: 98.6 F (37 C)  TempSrc: Oral  SpO2: 96%  Weight: (!) 335 lb (152 kg)  Height: 5' 8.75" (1.746 m)   Body mass index is 49.83 kg/m. Physical Exam Vitals signs reviewed.  Constitutional:      General: He is not in acute distress.    Appearance: Normal appearance. He is obese. He is not ill-appearing or toxic-appearing.  HENT:     Head: Normocephalic and atraumatic.     Right Ear: Tympanic membrane, ear canal and external ear normal.     Left Ear: Tympanic membrane, ear canal and external ear normal.     Nose: Nose normal. No rhinorrhea.     Mouth/Throat:     Mouth: Mucous membranes are moist.     Pharynx: Oropharynx is clear. No oropharyngeal exudate.  Eyes:     General: No scleral icterus.    Extraocular Movements: Extraocular movements intact.     Pupils: Pupils are equal, round, and reactive to light.     Comments: glasses  Cardiovascular:     Rate and Rhythm: Normal rate and regular rhythm.     Pulses: Normal pulses.     Heart sounds: Normal heart sounds.  Pulmonary:     Effort: Pulmonary effort is normal.     Breath sounds: Normal breath sounds. No wheezing, rhonchi or rales.  Abdominal:     General: Bowel sounds are normal. There is no distension.     Palpations: Abdomen is soft. There is no mass.     Tenderness: There is no abdominal tenderness. There is no right CVA tenderness, left CVA tenderness, guarding or rebound.  Musculoskeletal: Normal range of motion.     Right lower leg:  No edema.     Left lower leg: No edema.     Comments: Was lifting his feet while walking during my assessment (but does not at home); using cane; no deformity or active swelling of mcp joints of hands  Skin:    General: Skin is warm and dry.     Capillary Refill: Capillary refill takes less than 2 seconds.  Neurological:     General: No focal deficit present.     Mental Status: He is alert and oriented to person, place, and time.     Cranial Nerves: No cranial nerve deficit.       Sensory: Sensory deficit present.     Motor: Weakness present.     Coordination: Coordination normal.     Gait: Gait abnormal.     Deep Tendon Reflexes: Reflexes normal.     Comments: But does have some short term memory loss--repeated questions and stories a little bit--fixated on prognosis; bilateral essential tremor--pt can stop with holding onto his cane or putting his hand in his lap  Psychiatric:     Comments: Visibly upset when discussing the changes he's aware of in his life; seems down, affect a bit flat though he tries to joke around    Labs reviewed:  Pulled from careeverywhere Basic Metabolic Panel: Recent Labs    12/07/18  NA 136*  K 4.2  BUN 16  CREATININE 0.9   Liver Function Tests: Recent Labs    12/07/18  AST 16  ALT 22  ALKPHOS 70   No results for input(s): LIPASE, AMYLASE in the last 8760 hours. No results for input(s): AMMONIA in the last 8760 hours. CBC: No results for input(s): WBC, NEUTROABS, HGB, HCT, MCV, PLT in the last 8760 hours. Cardiac Enzymes: No results for input(s): CKTOTAL, CKMB, CKMBINDEX, TROPONINI in the last 8760 hours. BNP: Invalid input(s): POCBNP Lab Results  Component Value Date   HGBA1C 8.1 12/07/2018   No results found for: TSH Lab Results  Component Value Date   VITAMINB12 196 12/07/2018   No results found for: FOLATE No results found for: IRON, TIBC, FERRITIN  Imaging and Procedures noted on new patient packet: 12/03/18 MRI brain at [facility]:  Abnormal MRI of the Brain without contrast. 1. Mild-moderate degree of chronic microvascular ischemic changes of the supratentorial white matter tracts and left cerebellar hemisphere. No signs of acute ischemia or hemorrhagic components. 2. Large left maxillary mucous retention cyst. Scattered mucus inflammatory changes of ethmoid and sphenoid sinuses. 3. Dolichoectasia of the intracranial vessels. 4. Mild diffuse cortical atrophy.  [place] RUQ 10/03/18:  1. Small gallstones  and near the neck of the gallbladder. No pain over the gallbladder with compression. 2. Hepatic steatosis.  Retrograde urography/pyelogram 05/23/18 for hematuria:  Mild fullness right intrarenal collecting system. No filling defect in either intrarenal collecting system. No ureteral abnormality evident.  Assessment/Plan 1. Depression, major, single episode, moderate (HCC) - will taper off celexa and start wellbutrin right away--instructions provided in avs - buPROPion (WELLBUTRIN XL) 150 MG 24 hr tablet; Take 1 tablet (150 mg total) by mouth daily.  Dispense: 30 tablet; Refill: 3  2. Memory loss -I want to be sure that depression is not the cause of his declining cognition and hallucinations (?pseudodementia)  b/c there certainly does seem to be a component of this -he may need a full battery of neuropsych testing if we do not notice an overt change with treatment with wellbutrin -I am concerned this may be Lewy Body Dementia  given the falls/tremor/hallucinations and memory loss which have been fairly rapid as well as his family history -will update mmse next visit and do formal PHQ-9 on him also  -also advised to increase B12 supplement to 1053mcg daily to replete low B12 to goal over 500   3. Tremor -does look essential not resting/PD ish so could be confounding here  4. Falls frequently -would like him to get a formal PT eval but did not want to push the issue on our first meeting when he lacks much motivation to do things--also transportation will be a challenge  5. Type 2 diabetes mellitus with diabetic neuropathy, without long-term current use of insulin (HCC) -control should be better which would help with secondary prevention for further vascular damage Lab Results  Component Value Date   HGBA1C 8.1 12/07/2018  -cont lipitor, trulicity, glipizide, metformin -has great appetite, but makes poor choices -educated some on diet--he is aware of what he should eat, but prefers  unhealthy red meats and potatoes and does not eat many healthy veggies  6. Mixed hyperlipidemia -cont statin therapy--had lipids also with other labs in Dec -goal LDL <70 and his was 35; TG were still high at 163  7. Barrett's esophagus with dysplasia -notable.   -cont PPI, work on diet, conservative measure with elevation of HOB and avoiding culprit foods near bed -GI monitoring  8. Morbid obesity ([place]) -ongoing, he knows weight loss would help him -improve diet, also try to work on improving mobility with therapy next time -of note, he also has obstructive sleep apnea that is not treated, discussed need for sleep study to be done and CPAP use--he does snore  9. Essential hypertension -bp is well controlled, cont the same regimen here  10. Gastroesophageal reflux disease with esophagitis -as in Barrett's, cont PPI  11. Auditory hallucinations -typically Lewy Body will have visual hallucinations and auditory tend to be more with psychiatric disease like depression with psychosis (possibility) or schizophrenia (does not develop in seniors) -tx depression and see what happens here -has had some visual, as well, which is concerning  12. Noise-induced hearing loss of both ears -really needs this managed better b/c it could also be affecting his auditory hallucinations--recommend audiology f/u, but he needs some motivation first to do these things   Labs/tests ordered:   Orders Placed This Encounter  Procedures  . Hemoglobin A1c    This external order was created through the Results Console.  . Basic metabolic panel    This external order was created through the Results Console.  . Vitamin B12    This external order was created through the Results Console.  . Hepatic function panel    This external order was created through the Results Console.   He declined pneumonia, shingles and flu shots.  Rudy Luhmann L. [place], D.O. [facility] [address], [place] [address] Cell Phone (Mon-Fri 8am-5pm):  [phone] On Call:  [phone] & follow prompts after 5pm & weekends Office Phone:  [phone] Office Fax:  [phone]

## 2019-01-28 NOTE — Patient Instructions (Addendum)
Decrease your celexa to 20mg  daily for one week, then discontinue.  Increase B12 to 1099mcg daily to get that level up to normal.    Begin wellbutrin XL 150mg  daily for depression.  Continue to work on improving your diet--fewer starchy carbohydrates like potatoes, rice, bread.  More leafy greens and other healthy vegetables.  This will improve your sugar and cholesterol.

## 2019-02-11 DIAGNOSIS — G4733 Obstructive sleep apnea (adult) (pediatric): Secondary | ICD-10-CM | POA: Diagnosis not present

## 2019-02-21 ENCOUNTER — Telehealth: Payer: Self-pay | Admitting: Internal Medicine

## 2019-02-21 NOTE — Telephone Encounter (Signed)
Started wellbutrin after ov on 01/28/2019, within a 2 wks pt has started having vivid dreams that have interrupted his sleep to the point that he has so much anxiety through out the day.   Daughter(Brandon [name]) & wife Brandon [name]) spend time consoling Brandon [name] that these dreams are not real, such as the things "he sees outside".delusional Daughter says he is taking the wellbutrin correctly, however when he was in the tapering off celexa, made him a little confused & he kept reverting back to wanting the celexa. Therefore, wife & daughter feels he didn't taper off celexa as quickly as expected. He's definitely been on the Wellbutrin full time for 3 wks now.  Please call to advise or let them know if he needs to be seen.  Thanks, Vilinda Blanks.

## 2019-02-21 NOTE — Telephone Encounter (Signed)
I left Brandon [name] a message:  I'm wondering if this is due to the wellbutrin or a progression of his cognitive disorder.  I suppose we can take him back off wellbutrin and see where we are.  I would like for them to keep a really close eye on his mood during this time because of our concerns about depression, also.  I know he has an appt in another three weeks on 03/11/2019.  If we stop the wellbutrin, we can see how he is off all of the psychoactive meds.

## 2019-03-11 ENCOUNTER — Other Ambulatory Visit: Payer: Self-pay

## 2019-03-11 ENCOUNTER — Telehealth (INDEPENDENT_AMBULATORY_CARE_PROVIDER_SITE_OTHER): Payer: BLUE CROSS/BLUE SHIELD | Admitting: Internal Medicine

## 2019-03-11 DIAGNOSIS — G3183 Dementia with Lewy bodies: Secondary | ICD-10-CM

## 2019-03-11 DIAGNOSIS — F4312 Post-traumatic stress disorder, chronic: Secondary | ICD-10-CM

## 2019-03-11 DIAGNOSIS — F0281 Dementia in other diseases classified elsewhere with behavioral disturbance: Secondary | ICD-10-CM

## 2019-03-11 DIAGNOSIS — R251 Tremor, unspecified: Secondary | ICD-10-CM

## 2019-03-11 DIAGNOSIS — F321 Major depressive disorder, single episode, moderate: Secondary | ICD-10-CM

## 2019-03-11 DIAGNOSIS — E1140 Type 2 diabetes mellitus with diabetic neuropathy, unspecified: Secondary | ICD-10-CM

## 2019-03-11 DIAGNOSIS — R440 Auditory hallucinations: Secondary | ICD-10-CM

## 2019-03-11 MED ORDER — BUPROPION HCL ER (XL) 300 MG PO TB24: 300.0000 mg | ORAL_TABLET | Freq: Every day | ORAL | 3 refills | Status: DC

## 2019-03-11 NOTE — Progress Notes (Signed)
This service is provided via telemedicine  Location of patient (ex: home, work):  HOME  Patient consents to a telephone visit:  YES  Location of the provider (ex: office, home):  OFFICE  Name of any referring provider:  Braeleigh Pyper, DO  Names of all persons participating in the telemedicine service and their role in the encounter:  Brandon [name], [place], [employer], DAUGHTER, Brandon [name], WIFE, Brandon [name] PT, [place], DO  Time spent on call:  4:24  Virtual Visit via Telephone Note  I connected with Brandon [name] on 03/11/19 at  2:30 PM EDT by telephone and verified that I am speaking with the correct person using two identifiers.   I discussed the limitations, risks, security and privacy concerns of performing an evaluation and management service by telephone and the availability of in person appointments. I also discussed with the patient that there may be a patient responsible charge related to this service. The patient expressed understanding and agreed to proceed.   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    Agitation and bad dreams that we thought might be from the wellbutrin--he wanted to stop, but then he got very agitated and mean.  He thought it was b/c he was not taking his antidepressant.  He had dreams, but less vivid.  It's tapered down.  His wife does not know if changing it or increasing it is the next step.  He does not feel like it does as good as the "xanax" does.  He used to be on xanax for anxiety sporadically.  If he got very agitated, he would take one.  He's not understanding that the wellbutrin  He's had a couple of episodes of people talking to him--they are calmed down.    He gets agitated a lot easier and it's hard to calm  him down.    They also had an unexpected death in the family a few days after this last appt.   Was dreaming about people he knew who died.  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  Every now and then, he has a cold sweat with the dreams at night.    Brandon [name] reports, he is up and doing things more than he was before.  Of course, he's limited in what he can do now with isolation.    Brandon [name] reports only a couple of episodes since last time, but nothing bad.    No more falls.  Tremors might be a little better.  It's been more when they've gone out to eat and he's holding a glass or spoon.  She may not have paid attention.  Brandon [name] notes no change.  He's able to write better more like he used to.    He had a sleep study, but not as bad as expected.  They wanted to give him the auto-pap rather than continuous or bipap.  They're waiting for a phone call about next steps.  His wife and pt don't think he'll wear it.  Brandon [name] questions if it was reliable with the home test b/c he's a big mouth breather.  It's elective so not going to get done at this point.    Diabetes:  Glucose fair to [place].  He's binging on sweets.  Gets up midmorning and does a brunch  instead of breakfast--will be savory.  Then wants sweet at 1:30-2pm--it's not 1-2 cookies with a drink--he'll have a handful plus.  He hides his ice cream or chips or other snacks that are for the grandchildren--Brandon discovers he's been eating them when they want something.  Eats lunch meats and processed foods.  They do have fresh fruit, popcorn, yogurts, cheeses but he prefers other snacks.  He likes foods he does not need to prepare.  He used to love cooking, but doesn't anymore.  Brandon [name] reports they could work on the sugar.      He made an error with a bill recently.  They can't remember exactly what.  He talks about his short-term memory being before--he doesn't remember my name even after several discussions.   He admits his problem-solving has  declined.  He doesn't want to take on new problems like an application to fill in a form.  When he reads, he has to reread things to remember.  Assessment and Plan: 1. Lewy body dementia with behavioral disturbance (Brandon [name]) -seems that this is unfortunately the cause of his declining cognitive status and hallucination, frequent falls and tremors -will plan to add aricept in 4 wks   2. Chronic post-traumatic stress disorder (PTSD) -remains an issue -may need to have xanax on hand for episodes of this despite its risk in geriatric patients (it is the best med for panic attacks related to this condition)  3. Depression, major, single episode, moderate (HCC) -increase wellbutrin to 300mg  po daily   4. Tremor -may be slightly better per his wife, but no notable difference to his daughter--concern that this is due to lewy body disease -fortunately, no more falls since last visit  5. Type 2 diabetes mellitus with diabetic neuropathy, without long-term current use of insulin (HCC) -still not making good food choices, but is a bit more overall active--not exercising, but up and around his home -cont current diabetes regimen -will need f/u labs when it's safe to have people out and about with the viral pandemic  6. Auditory hallucinations -sounds like they continue but less frequent -seems he sees the ghostly figures and they speak to him -suspect lewy body dementia, unfortunately  Follow Up Instructions: Increase your wellbutrin to 300mg  daily.    Try to improve your diet. Eat fewer foods with preservatives like hot dogs, lunch meats, snacks like potato chips, sweets like ice cream. Try to replace those foods with healthier items like fruit, yogurt, low fat cheese, veggies.  In 4 weeks, we'll check in with another phone visit to see how you're doing.  We will plan to add aricept 5mg  at that time to help preserve your function over time.     I discussed the assessment and treatment plan with  the patient. The patient was provided an opportunity to ask questions and all were answered. The patient agreed with the plan and demonstrated an understanding of the instructions.   The patient was advised to call back or seek an in-person evaluation if the symptoms worsen or if the condition fails to improve as anticipated.  I provided 28 minutes of non-face-to-face time during this encounter.  Analeah Brame L. Chandell Attridge, D.O. [facility] [address], [place] [address] Cell Phone (Mon-Fri 8am-5pm):  [phone] On Call:  [phone] & follow prompts after 5pm & weekends Office Phone:  [phone] Office Fax:  [phone]

## 2019-03-11 NOTE — Patient Instructions (Signed)
Increase your wellbutrin to 300mg  daily.    Try to improve your diet. Eat fewer foods with preservatives like hot dogs, lunch meats, snacks like potato chips, sweets like ice cream. Try to replace those foods with healthier items like fruit, yogurt, low fat cheese, veggies.  In 4 weeks, we'll check in with another phone visit to see how you're doing.  We will plan to add aricept 5mg  at that time to help preserve your function over time.

## 2019-04-02 ENCOUNTER — Other Ambulatory Visit: Payer: Self-pay | Admitting: *Deleted

## 2019-04-02 MED ORDER — DULAGLUTIDE 1.5 MG/0.5ML ~~LOC~~ SOAJ: 1.5000 mg | SUBCUTANEOUS | 3 refills | Status: DC

## 2019-04-02 NOTE — Telephone Encounter (Signed)
Patient caregiver Requested

## 2019-04-14 ENCOUNTER — Encounter: Payer: Self-pay | Admitting: Internal Medicine

## 2019-04-22 ENCOUNTER — Ambulatory Visit: Payer: Self-pay | Admitting: Internal Medicine

## 2019-04-25 ENCOUNTER — Ambulatory Visit (INDEPENDENT_AMBULATORY_CARE_PROVIDER_SITE_OTHER): Payer: BLUE CROSS/BLUE SHIELD | Admitting: Internal Medicine

## 2019-04-25 ENCOUNTER — Other Ambulatory Visit: Payer: Self-pay

## 2019-04-25 ENCOUNTER — Encounter: Payer: Self-pay | Admitting: Internal Medicine

## 2019-04-25 DIAGNOSIS — R251 Tremor, unspecified: Secondary | ICD-10-CM | POA: Diagnosis not present

## 2019-04-25 DIAGNOSIS — E782 Mixed hyperlipidemia: Secondary | ICD-10-CM

## 2019-04-25 DIAGNOSIS — G3183 Dementia with Lewy bodies: Secondary | ICD-10-CM

## 2019-04-25 DIAGNOSIS — E1140 Type 2 diabetes mellitus with diabetic neuropathy, unspecified: Secondary | ICD-10-CM

## 2019-04-25 DIAGNOSIS — F321 Major depressive disorder, single episode, moderate: Secondary | ICD-10-CM | POA: Diagnosis not present

## 2019-04-25 DIAGNOSIS — R440 Auditory hallucinations: Secondary | ICD-10-CM

## 2019-04-25 DIAGNOSIS — F4312 Post-traumatic stress disorder, chronic: Secondary | ICD-10-CM | POA: Diagnosis not present

## 2019-04-25 DIAGNOSIS — F0281 Dementia in other diseases classified elsewhere with behavioral disturbance: Secondary | ICD-10-CM

## 2019-04-25 MED ORDER — DONEPEZIL HCL 5 MG PO TABS: 5.0000 mg | ORAL_TABLET | Freq: Every day | ORAL | 1 refills | Status: DC

## 2019-04-25 MED ORDER — LORAZEPAM 0.5 MG PO TABS: 0.2500 mg | ORAL_TABLET | Freq: Three times a day (TID) | ORAL | 1 refills | Status: DC | PRN

## 2019-04-25 NOTE — Progress Notes (Signed)
Patient ID: Brandon [name], male   DOB: [birth year], 63 y.o.   MRN: [id number] This service is provided via telemedicine  No vital signs collected/recorded due to the encounter was a telemedicine visit.   Location of patient (ex: home, work):  HOME  Patient consents to a telephone visit:  YES  Location of the provider (ex: office, home):  OFFICE  Name of any referring provider:  DR [facility] Kin Galbraith DO  Names of all persons participating in the telemedicine service and their role in the encounter:  PATIENT, WIFE, DESHANNON SMITH CMA, Matrice Herro DO  Time spent on call:  2:06    Provider:  Charnele Semple L. Mariea Clonts, D.O., C.M.D.  Goals of Care:  Advanced Directives 01/28/2019  Does Patient Have a Medical Advance Directive? Yes  Does patient want to make changes to medical advance directive? No - Patient declined  Pre-existing out of facility DNR order (yellow form or pink MOST form) -     Chief Complaint  Patient presents with  . Medical Management of Chronic Issues    4week follow-up    HPI: Patient is a 63 y.o. male seen today for medical management of chronic diseases.    He was out working in the garden.    Ivin Booty notes he is trembling a little bit more.  He is also having a bit more trouble writing.  He has to focus more when addressing an envelope.  Both hands are affected.    He's not combative, but getting agitated over minor things, gets focused on it and it's hard to get him redirected.  His anxiety level has heightened.  He's had no evidence of seeing or hearing things that are not there since increasing the wellbutrin.  He does not have patience.  It takes hours to unwind him and then he will admit to being inpatient.  He isn't doing anything irrational, but just wandering around sitting in the rocking chair pouting until it passes.  Needs anxiety medicine 2-3 times a day.  Focusing and calm, tremor improves also.  He stays up until 3am, then sleepy all day.    Hearing has also  gotten worse.    DMII:  Sugar is probably up with staying home, not getting out.  Diet has not been the best.  They have not been out to get healthier foods.  He has been more physically active.    He's been having chest pressure when he gets upset and agitated.  He actually takes a ntg which relieves it.   He also takes a baby asa with it.  Ivin Booty suggests he be on a lorazepam 0.25mg  when he gets like this.    They got him melatonin to try to get him back on schedule with staying up light.    Past Medical History:  Diagnosis Date  . B12 deficiency    Per New Patient Packet   . Barrett esophagus    Per New Patient Packet   . Depression    Per New Patient Packet   . Diabetes [facility])    Per New Patient Packet   . Fatty liver    Per New Patient Packet   . GERD (gastroesophageal reflux disease)   . Gout    Per New Patient Packet   . History of cardiac cath 2014   Dr.Berry, Per New Patient Packet   . History of esophagogastroduodenoscopy (EGD)    Per New Patient Packet   . History of tobacco use   .  HLD (hyperlipidemia)   . HOH (hard of hearing)    Per New Patient Packet   . HTN (hypertension)   . Memory loss    Per New Patient Packet   . Numbness and tingling of both feet    Per New Patient Packet   . Obesity   . OSA (obstructive sleep apnea)    Per New Patient Packet, No CPAP  . Prostate cancer [facility])    Per New Patient Packet   . Rheumatoid arthritis [facility])    Per New Patient Packet   . Tremor    Per New Patient Packet   . Unsteady gait    Per New Patient Packet     Past Surgical History:  Procedure Laterality Date  . KNEE SURGERY Right    Per New Patient Packet   . LEFT HEART CATHETERIZATION WITH CORONARY ANGIOGRAM N/A 06/04/2013   Procedure: LEFT HEART CATHETERIZATION WITH CORONARY ANGIOGRAM;  Surgeon: Lorretta Harp, MD;  Location[facility] C[facility] LAB;  Service: Cardiovascular;  Laterality: N/A;  . TRANSURETHRAL RESECTION OF PROSTATE  05/23/2018   Per New Patient  Packet, [place]   . UPPER GASTROINTESTINAL ENDOSCOPY  2001   Per New Patient Packet   . VASECTOMY  1984   Per New Patient Packet     Allergies  Allergen Reactions  . Bee Venom Anaphylaxis    Outpatient Encounter Medications as of 04/25/2019  Medication Sig  . allopurinol (ZYLOPRIM) 300 MG tablet Take 300 mg by mouth daily.  . Aspirin-Acetaminophen-Caffeine (GOODYS EXTRA STRENGTH) [phone] MG PACK Take by mouth as needed.  Marland Kitchen atorvastatin (LIPITOR) 40 MG tablet Take 40 mg by mouth daily.  Marland Kitchen buPROPion (WELLBUTRIN XL) 300 MG 24 hr tablet Take 1 tablet (300 mg total) by mouth daily.  Marland Kitchen dexlansoprazole (DEXILANT) 60 MG capsule Take 60 mg by mouth daily as needed.  . Dulaglutide (TRULICITY) 1.5 DQ/2.2WL SOPN Inject 1.5 mg into the skin every 7 (seven) days.  Marland Kitchen glipiZIDE (GLUCOTROL XL) 5 MG 24 hr tablet Take 5 mg by mouth daily with breakfast.  . lisinopril (PRINIVIL,ZESTRIL) 20 MG tablet Take 20 mg by mouth daily.  . metFORMIN (GLUCOPHAGE) 1000 MG tablet Take 1,000 mg by mouth 2 (two) times daily with a meal.  . nitroGLYCERIN (NITROSTAT) 0.4 MG SL tablet Place 0.4 mg under the tongue every 5 (five) minutes as needed for chest pain.  . pantoprazole (PROTONIX) 40 MG tablet Take 40 mg by mouth daily as needed.  . Turmeric Curcumin 500 MG CAPS Take by mouth daily.  . vitamin B-12 (CYANOCOBALAMIN) 1000 MCG tablet Take 1,000 mcg by mouth daily.    No facility-administered encounter medications on file as of 04/25/2019.     Review of Systems:  Review of Systems  Constitutional: Positive for malaise/fatigue. Negative for chills and fever.  HENT: Positive for hearing loss. Negative for congestion.   Eyes: Negative for blurred vision.  Respiratory: Negative for cough and shortness of breath.   Cardiovascular: Positive for chest pain. Negative for palpitations and leg swelling.  Gastrointestinal: Negative for abdominal pain.  Genitourinary: Negative for dysuria.  Musculoskeletal: Negative  for falls.  Skin: Negative for itching and rash.  Neurological: Positive for tremors. Negative for dizziness and loss of consciousness.  Endo/Heme/Allergies: Does not bruise/bleed easily.  Psychiatric/Behavioral: Positive for memory loss. Negative for depression and hallucinations. The patient is nervous/anxious and has insomnia.        Spirits improved; no recent auditory or visual hallucinations noted    Health Maintenance  Topic Date Due  . Hepatitis C Screening  Sep 22, 1956  . HIV Screening  12/15/1971  . COLONOSCOPY  12/14/2006  . INFLUENZA VACCINE  07/20/2019  . TETANUS/TDAP  09/18/2024    Physical Exam: Could not be performed as visit non face-to-face via phone   Labs reviewed: Basic Metabolic Panel: Recent Labs    12/07/18  NA 136*  K 4.2  BUN 16  CREATININE 0.9   Liver Function Tests: Recent Labs    12/07/18  AST 16  ALT 22  ALKPHOS 70   No results for input(s): LIPASE, AMYLASE in the last 8760 hours. No results for input(s): AMMONIA in the last 8760 hours. CBC: No results for input(s): WBC, NEUTROABS, HGB, HCT, MCV, PLT in the last 8760 hours. Lipid Panel: No results for input(s): CHOL, HDL, LDLCALC, TRIG, CHOLHDL, LDLDIRECT in the last 8760 hours. Lab Results  Component Value Date   HGBA1C 8.1 12/07/2018    Procedures since last visit: No results found.  Assessment/Plan 1. Lewy body dementia with behavioral disturbance ([place]) - continues with some ocd features, tremor worse lately, periods of agitation -seems hallucinations better with more wellbutrin to treat depression component -hoping wellbutrin dose is not causing the agitation or sleep cycle alteration since it's stimulating - donepezil (ARICEPT) 5 MG tablet; Take 1 tablet (5 mg total) by mouth at bedtime.  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 Chronic post-traumatic stress disorder (PTSD) - begin ativan very low dose and try to gradually reduce as we meet if we can get his panic attack type events under wraps - LORazepam (ATIVAN) 0.5 MG tablet; Take 0.5 tablets (0.25 mg total) by mouth 3 (three) times daily as needed for anxiety.  Dispense: 90 tablet; Refill: 1  3. Depression, major, single episode, moderate (HCC) -cont wellbutrin 300mg  and monitor--seems spirits have improved, but now with agitated spells as described - LORazepam (ATIVAN) 0.5 MG tablet; Take 0.5 tablets (0.25 mg total) by mouth 3 (three) times daily as needed for anxiety.  Dispense: 90 tablet; Refill: 1  4. Tremor - worse lately, hoping low dose benzo will help as his wife notices this is far worse when he's upset and better when calm - LORazepam (ATIVAN) 0.5 MG tablet; Take 0.5 tablets (0.25 mg total) by mouth 3 (three) times daily as needed for anxiety.  Dispense: 90 tablet; Refill: 1  5. Type 2 diabetes mellitus with diabetic neuropathy, without long-term current use of insulin (HCC) - cont current diabetes regimen -must improve diet, fortunately has been more active - CBC with Differential/Platelet; Future - COMPLETE METABOLIC PANEL WITH GFR; Future - Hemoglobin A1c; Future  6. Auditory hallucinations - better with depression tx  7. Mixed hyperlipidemia - f/u lab - Lipid panel; Future  Labs/tests ordered:   Orders Placed This Encounter  Procedures  . CBC with Differential/Platelet    Standing Status:   Future    Standing Expiration Date:   04/24/2020  . COMPLETE METABOLIC PANEL WITH GFR    Standing Status:   Future    Standing Expiration Date:   04/24/2020  . Hemoglobin A1c    Standing Status:   Future    Standing Expiration Date:   04/24/2020  . Lipid panel    Standing Status:   Future    Standing Expiration Date:  04/24/2020    Next appt:  05/27/2019  Non face-to-face time spent on televisit:  28 minutes  Nelle Sayed L.  Neyra Pettie, D.O. [facility] [address], [place] [address] Cell Phone (Mon-Fri 8am-5pm):  [phone] On Call:  [phone] & follow prompts after 5pm & weekends Office Phone:  [phone] Office Fax:  [phone]

## 2019-05-24 ENCOUNTER — Other Ambulatory Visit: Payer: BC Managed Care – PPO

## 2019-05-24 ENCOUNTER — Other Ambulatory Visit: Payer: Self-pay

## 2019-05-24 DIAGNOSIS — E782 Mixed hyperlipidemia: Secondary | ICD-10-CM

## 2019-05-24 DIAGNOSIS — E1140 Type 2 diabetes mellitus with diabetic neuropathy, unspecified: Secondary | ICD-10-CM

## 2019-05-25 LAB — COMPLETE METABOLIC PANEL WITH GFR
AG Ratio: 1.7 (calc) (ref 1.0–2.5)
ALT: 28 U/L (ref 9–46)
AST: 21 U/L (ref 10–35)
Albumin: 4.3 g/dL (ref 3.6–5.1)
Alkaline phosphatase (APISO): 62 U/L (ref 35–144)
BUN: 25 mg/dL (ref 7–25)
CO2: 22 mmol/L (ref 20–32)
Calcium: 9.2 mg/dL (ref 8.6–10.3)
Chloride: 103 mmol/L (ref 98–110)
Creat: 1.11 mg/dL (ref 0.70–1.25)
GFR, Est African American: 82 mL/min/{1.73_m2} (ref >=60)
GFR, Est Non African American: 71 mL/min/{1.73_m2} (ref >=60)
Globulin: 2.6 g/dL (calc) (ref 1.9–3.7)
Glucose, Bld: 168 mg/dL — ABNORMAL HIGH (ref 65–99)
Potassium: 4.4 mmol/L (ref 3.5–5.3)
Sodium: 138 mmol/L (ref 135–146)
Total Bilirubin: 0.8 mg/dL (ref 0.2–1.2)
Total Protein: 6.9 g/dL (ref 6.1–8.1)

## 2019-05-25 LAB — CBC WITH DIFFERENTIAL/PLATELET
Absolute Monocytes: 892 cells/uL (ref 200–950)
Basophils Absolute: 55 cells/uL (ref 0–200)
Basophils Relative: 0.6 %
Eosinophils Absolute: 237 cells/uL (ref 15–500)
Eosinophils Relative: 2.6 %
HCT: 44.5 % (ref 38.5–50.0)
Hemoglobin: 14.8 g/dL (ref 13.2–17.1)
Lymphs Abs: 2248 cells/uL (ref 850–3900)
MCH: 29.4 pg (ref 27.0–33.0)
MCHC: 33.3 g/dL (ref 32.0–36.0)
MCV: 88.3 fL (ref 80.0–100.0)
MPV: 10.5 fL (ref 7.5–12.5)
Monocytes Relative: 9.8 %
Neutro Abs: 5669 cells/uL (ref 1500–7800)
Neutrophils Relative %: 62.3 %
Platelets: 254 10*3/uL (ref 140–400)
RBC: 5.04 10*6/uL (ref 4.20–5.80)
RDW: 12.5 % (ref 11.0–15.0)
Total Lymphocyte: 24.7 %
WBC: 9.1 10*3/uL (ref 3.8–10.8)

## 2019-05-25 LAB — LIPID PANEL
Cholesterol: 111 mg/dL (ref <200)
HDL: 45 mg/dL (ref >=40)
LDL Cholesterol (Calc): 36 mg/dL (calc)
Non-HDL Cholesterol (Calc): 66 mg/dL (calc) (ref <130)
Total CHOL/HDL Ratio: 2.5 (calc) (ref <5.0)
Triglycerides: 240 mg/dL — ABNORMAL HIGH (ref <150)

## 2019-05-25 LAB — HEMOGLOBIN A1C
Hgb A1c MFr Bld: 8.9 % of total Hgb — ABNORMAL HIGH (ref <5.7)
Mean Plasma Glucose: 209 (calc)
eAG (mmol/L): 11.6 (calc)

## 2019-05-27 ENCOUNTER — Encounter: Payer: Self-pay | Admitting: *Deleted

## 2019-05-27 ENCOUNTER — Ambulatory Visit: Payer: Self-pay | Admitting: Internal Medicine

## 2019-05-27 ENCOUNTER — Telehealth: Payer: Self-pay | Admitting: *Deleted

## 2019-05-27 NOTE — Telephone Encounter (Signed)
A user error has taken place: encounter opened in error, closed for administrative reasons.

## 2019-05-27 NOTE — Telephone Encounter (Signed)
-----   Message from Gayland [name], DO sent at 05/27/2019  1:37 PM EDT ----- Yes, let's increase the wellbutrin to 300mg .  Please send to pharmacy.

## 2019-06-02 DIAGNOSIS — S4992XA Unspecified injury of left shoulder and upper arm, initial encounter: Secondary | ICD-10-CM | POA: Diagnosis not present

## 2019-06-02 DIAGNOSIS — M25512 Pain in left shoulder: Secondary | ICD-10-CM | POA: Diagnosis not present

## 2019-06-02 DIAGNOSIS — R0781 Pleurodynia: Secondary | ICD-10-CM | POA: Diagnosis not present

## 2019-06-03 DIAGNOSIS — E119 Type 2 diabetes mellitus without complications: Secondary | ICD-10-CM | POA: Diagnosis not present

## 2019-06-03 DIAGNOSIS — N390 Urinary tract infection, site not specified: Secondary | ICD-10-CM | POA: Diagnosis not present

## 2019-06-03 DIAGNOSIS — Z9181 History of falling: Secondary | ICD-10-CM | POA: Diagnosis not present

## 2019-06-03 DIAGNOSIS — K219 Gastro-esophageal reflux disease without esophagitis: Secondary | ICD-10-CM | POA: Diagnosis not present

## 2019-06-03 DIAGNOSIS — R296 Repeated falls: Secondary | ICD-10-CM | POA: Diagnosis not present

## 2019-06-03 DIAGNOSIS — G44319 Acute post-traumatic headache, not intractable: Secondary | ICD-10-CM | POA: Diagnosis not present

## 2019-06-03 DIAGNOSIS — R55 Syncope and collapse: Secondary | ICD-10-CM | POA: Diagnosis not present

## 2019-06-03 DIAGNOSIS — Z87891 Personal history of nicotine dependence: Secondary | ICD-10-CM | POA: Diagnosis not present

## 2019-06-03 DIAGNOSIS — S0990XA Unspecified injury of head, initial encounter: Secondary | ICD-10-CM | POA: Diagnosis not present

## 2019-06-03 DIAGNOSIS — R51 Headache: Secondary | ICD-10-CM | POA: Diagnosis not present

## 2019-06-03 DIAGNOSIS — W1789XA Other fall from one level to another, initial encounter: Secondary | ICD-10-CM | POA: Diagnosis not present

## 2019-06-03 MED ORDER — SODIUM CHLORIDE 0.9 % IV SOLN
10.00 | INTRAVENOUS | Status: DC
Start: ? — End: 2019-06-03

## 2019-06-03 MED ORDER — GENERIC EXTERNAL MEDICATION
Status: DC
Start: ? — End: 2019-06-03

## 2019-06-10 ENCOUNTER — Ambulatory Visit (INDEPENDENT_AMBULATORY_CARE_PROVIDER_SITE_OTHER): Payer: BC Managed Care – PPO | Admitting: Internal Medicine

## 2019-06-10 ENCOUNTER — Other Ambulatory Visit: Payer: Self-pay

## 2019-06-10 ENCOUNTER — Encounter: Payer: Self-pay | Admitting: Internal Medicine

## 2019-06-10 VITALS — BP 120/60 | HR 75 | Temp 98.0°F | Ht 69.0 in | Wt 326.0 lb

## 2019-06-10 DIAGNOSIS — I480 Paroxysmal atrial fibrillation: Secondary | ICD-10-CM

## 2019-06-10 DIAGNOSIS — R55 Syncope and collapse: Secondary | ICD-10-CM

## 2019-06-10 DIAGNOSIS — R296 Repeated falls: Secondary | ICD-10-CM | POA: Diagnosis not present

## 2019-06-10 DIAGNOSIS — G3183 Dementia with Lewy bodies: Secondary | ICD-10-CM

## 2019-06-10 DIAGNOSIS — R440 Auditory hallucinations: Secondary | ICD-10-CM

## 2019-06-10 DIAGNOSIS — R251 Tremor, unspecified: Secondary | ICD-10-CM

## 2019-06-10 DIAGNOSIS — F02818 Dementia in other diseases classified elsewhere, unspecified severity, with other behavioral disturbance: Secondary | ICD-10-CM

## 2019-06-10 DIAGNOSIS — E1140 Type 2 diabetes mellitus with diabetic neuropathy, unspecified: Secondary | ICD-10-CM

## 2019-06-10 DIAGNOSIS — F0281 Dementia in other diseases classified elsewhere with behavioral disturbance: Secondary | ICD-10-CM

## 2019-06-10 MED ORDER — APIXABAN 5 MG PO TABS: 5.0000 mg | ORAL_TABLET | Freq: Two times a day (BID) | ORAL | 3 refills | Status: DC

## 2019-06-10 NOTE — Progress Notes (Signed)
Location:  [facility] clinic Provider:  Kayana Thoen L. Mariea Clonts, D.O., C.M.D.  Goals of Care:  Advanced Directives 01/28/2019  Does Patient Have a Medical Advance Directive? Yes  Does patient want to make changes to medical advance directive? No - Patient declined  Pre-existing out of facility DNR order (yellow form or pink MOST form) -     Chief Complaint  Patient presents with  . Medical Management of Chronic Issues    follow-up, high fall risk    HPI: Patient is a 63 y.o. male seen today for medical management of chronic diseases.    He has lost 9 lbs.  He had two recent falls.   First from a ladder which he remembers falling off of.  Second was in his driveway blowing leaves--his wife found him down in the driveway and his daughter called 911 b/c he'd hit his head.  He had an eval at [facility] which was negative except that his shoulder ROM is limited and he has a positive drop arm test per his daughter who is a NP.    His sugars are not well-controlled.  He did well while his wife was off work due to the covid pandemic, but now that she's back, he's making poor food choices and not exercising like he'd been.  He feels like he's being treated as a child and he is upset with his wife.  He did not take his anxiety pill.   He tells me he fell Saturday off a ladder about 4 ft off the gorund and messed something up in his shoiulder.  He didn't go to the doctor.  Sunday, he went to [facility] due to pain in the night in the left arm.  There were no broken bones.  Monday, he was outside blowing leaes off the patio.  He wonders if he got too hot, passed out.  He thinks he laid in the rain for an hour befor he woke up.  He called his oldest daughter to help him get up and she called Jarrett Soho and Jarrett Soho called 911.  He then had to go on the ambulance to the hospital.  They treated him for a UTI.  He also had a run of afib which may have contributed.  Says it doesn't take much to wear him out  anymore.  He has ranged 130s-140 on avg.  A few in 180s and few in 120s.  He says he ain't going on insulin.  He says he knows he's fat and needs to lose weight.  He had two boiled eggs, a cup of yogurt and a glass of oj.  Says his medication has affected his appetite.  He can only eat one not two biscuits.  Has some neuropathy in his legs and hands.    He still shakes.  Mostly in his left hand if he's not thinking about it.    He admits to some chest pain when he is resting in bed at night.  NTG relieves this.  He stopped asa due to his barrett's esophagus.  He also has some fluttering occasionally.  Past Medical History:  Diagnosis Date  . B12 deficiency    Per New Patient Packet   . Barrett esophagus    Per New Patient Packet   . Depression    Per New Patient Packet   . Diabetes [facility])    Per New Patient Packet   . Fatty liver    Per New Patient Packet   .  GERD (gastroesophageal reflux disease)   . Gout    Per New Patient Packet   . History of cardiac cath 2014   Dr.Berry, Per New Patient Packet   . History of esophagogastroduodenoscopy (EGD)    Per New Patient Packet   . History of tobacco use   . HLD (hyperlipidemia)   . HOH (hard of hearing)    Per New Patient Packet   . HTN (hypertension)   . Memory loss    Per New Patient Packet   . Numbness and tingling of both feet    Per New Patient Packet   . Obesity   . OSA (obstructive sleep apnea)    Per New Patient Packet, No CPAP  . Prostate cancer [facility])    Per New Patient Packet   . Rheumatoid arthritis [facility])    Per New Patient Packet   . Tremor    Per New Patient Packet   . Unsteady gait    Per New Patient Packet     Past Surgical History:  Procedure Laterality Date  . KNEE SURGERY Right    Per New Patient Packet   . LEFT HEART CATHETERIZATION WITH CORONARY ANGIOGRAM N/A 06/04/2013   Procedure: LEFT HEART CATHETERIZATION WITH CORONARY ANGIOGRAM;  Surgeon: Lorretta Harp, MD;  Location: [facility] CATH LAB;  Service:  Cardiovascular;  Laterality: N/A;  . TRANSURETHRAL RESECTION OF PROSTATE  05/23/2018   Per New Patient Packet, [place]   . UPPER GASTROINTESTINAL ENDOSCOPY  2001   Per New Patient Packet   . VASECTOMY  1984   Per New Patient Packet     Allergies  Allergen Reactions  . Bee Venom Anaphylaxis    Outpatient Encounter Medications as of 06/10/2019  Medication Sig  . allopurinol (ZYLOPRIM) 300 MG tablet Take 300 mg by mouth daily.  . Aspirin-Acetaminophen-Caffeine (GOODYS EXTRA STRENGTH) [phone] MG PACK Take by mouth as needed.  Marland Kitchen atorvastatin (LIPITOR) 40 MG tablet Take 40 mg by mouth daily.  Marland Kitchen buPROPion (WELLBUTRIN XL) 300 MG 24 hr tablet Take 1 tablet (300 mg total) by mouth daily.  . celecoxib (CELEBREX) 200 MG capsule Take 200 mg by mouth daily.  . ciprofloxacin (CIPRO) 500 MG tablet TAKE 1 TABLET EVERY 12 HOURS FOR 10 DAYS  . dexlansoprazole (DEXILANT) 60 MG capsule Take 60 mg by mouth daily as needed.  . donepezil (ARICEPT) 5 MG tablet Take 1 tablet (5 mg total) by mouth at bedtime.  . Dulaglutide (TRULICITY) 1.5 JY/7.8GN SOPN Inject 1.5 mg into the skin every 7 (seven) days.  Marland Kitchen glipiZIDE (GLUCOTROL XL) 5 MG 24 hr tablet Take 5 mg by mouth daily with breakfast.  . lisinopril (PRINIVIL,ZESTRIL) 20 MG tablet Take 20 mg by mouth daily.  Marland Kitchen LORazepam (ATIVAN) 0.5 MG tablet Take 0.5 tablets (0.25 mg total) by mouth 3 (three) times daily as needed for anxiety.  . metFORMIN (GLUCOPHAGE) 1000 MG tablet Take 1,000 mg by mouth 2 (two) times daily with a meal.  . nitroGLYCERIN (NITROSTAT) 0.4 MG SL tablet Place 0.4 mg under the tongue every 5 (five) minutes as needed for chest pain.  . pantoprazole (PROTONIX) 40 MG tablet Take 40 mg by mouth daily as needed.  Marland Kitchen tiZANidine (ZANAFLEX) 2 MG tablet TAKE 1 OR 2 TABLETS 3 TIMES DAILY  . traMADol-acetaminophen (ULTRACET) 37.5-325 MG tablet TAKE 1 TABLET 4 TIMES DAILY AS NEEDED FOR PAIN  . Turmeric Curcumin 500 MG CAPS Take by mouth daily.  .  vitamin B-12 (CYANOCOBALAMIN) 1000 MCG tablet Take 1,000  mcg by mouth daily.    No facility-administered encounter medications on file as of 06/10/2019.     Review of Systems:  Review of Systems  Constitutional: Positive for malaise/fatigue. Negative for chills and fever.  HENT: Positive for hearing loss. Negative for congestion.   Eyes: Negative for blurred vision.       Glasses  Respiratory: Negative for cough and shortness of breath.   Cardiovascular: Positive for chest pain. Negative for leg swelling.  Genitourinary: Negative for dysuria.  Musculoskeletal: Positive for falls and joint pain.  Skin: Negative for itching and rash.       Left leg abrasion  Neurological: Positive for tingling and sensory change. Negative for dizziness and loss of consciousness.  Psychiatric/Behavioral: Positive for depression and memory loss. The patient is nervous/anxious. The patient does not have insomnia.     Health Maintenance  Topic Date Due  . Hepatitis C Screening  [birth year]  . HIV Screening  [birth year]1971  . COLONOSCOPY  12/14/2006  . INFLUENZA VACCINE  07/20/2019  . TETANUS/TDAP  09/18/2024    Physical Exam: Vitals:   06/10/19 1305  Weight: (!) 326 lb (147.9 kg)  Height: 5\' 9"  (1.753 m)   Body mass index is 48.14 kg/m. Physical Exam Vitals signs reviewed.  Constitutional:      General: He is not in acute distress.    Appearance: Normal appearance. He is obese. He is not ill-appearing or toxic-appearing.  HENT:     Head: Normocephalic and atraumatic.     Ears:     Comments: Refuses hearing aids, quite HOH Eyes:     Comments: glasses  Cardiovascular:     Rate and Rhythm: Normal rate and regular rhythm.     Pulses: Normal pulses.     Heart sounds: Normal heart sounds.  Pulmonary:     Effort: Pulmonary effort is normal.     Breath sounds: Normal breath sounds.  Musculoskeletal: Normal range of motion.        General: Tenderness present.     Comments: Tender over lateral  shoulder and at anterior aspect on deeper palpation; is now able to abduct his arm on his own on the left--there is a small raised muscle area on the left upper arm; right normal  Skin:    General: Skin is warm and dry.     Capillary Refill: Capillary refill takes less than 2 seconds.     Comments: Left lateral leg abrasion  Neurological:     General: No focal deficit present.     Mental Status: He is alert and oriented to person, place, and time.  Psychiatric:     Comments: Was very upset with his wife today and saying ugly things--his daughter was calming him down     Labs reviewed: Basic Metabolic Panel: Recent Labs    12/07/18 05/24/19 1131  NA 136* 138  K 4.2 4.4  CL  --  103  CO2  --  22  GLUCOSE  --  168*  BUN 16 25  CREATININE 0.9 1.11  CALCIUM  --  9.2   Liver Function Tests: Recent Labs    12/07/18 05/24/19 1131  AST 16 21  ALT 22 28  ALKPHOS 70  --   BILITOT  --  0.8  PROT  --  6.9   No results for input(s): LIPASE, AMYLASE in the last 8760 hours. No results for input(s): AMMONIA in the last 8760 hours. CBC: Recent Labs    05/24/19 1131  WBC 9.1  NEUTROABS 5,669  HGB 14.8  HCT 44.5  MCV 88.3  PLT 254   Lipid Panel: Recent Labs    05/24/19 1131  CHOL 111  HDL 45  LDLCALC 36  TRIG 240*  CHOLHDL 2.5   Lab Results  Component Value Date   HGBA1C 8.9 (H) 05/24/2019    Procedures since last visit: No results found.  Assessment/Plan 1. 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 Gwenlyn Found) and did not require intervention - Ambulatory referral to Cardiology, Dr. Gwenlyn Found, due to new paroxysmal afib and syncope--may need monitor, up to date echo, etc.    2. Paroxysmal atrial fibrillation ([place]) - discussed risk and benefit of anticoagulation--he's clearly high risk for stroke and has actually had some small prior strokes - apixaban (ELIQUIS) 5 MG  TABS tablet; Take 1 tablet (5 mg total) by mouth 2 (two) times daily.  Dispense: 60 tablet; Refill: 3 -emphasized danger of doing things like climbing the ladder when he's on blood thinners - Ambulatory referral to Cardiology  3. Falls frequently - two recent falls, second of which was syncopal episode - Ambulatory referral to Physical Therapy due to this and his left shoulder injury sustained with fall from ladder  4. Lewy body dementia with behavioral disturbance ([place]) - notably affecting his judgment--today I learned he had the folks putting up the invisible fencing for the dogs put the fence in some kind of strange pattern rather than around the perimeter of their property for example  5. Type 2 diabetes mellitus with diabetic neuropathy, without long-term current use of insulin (HCC) -cont current regimen and diet which has improved again since his last fall and stricter regulation of his diet  6. Tremor -left hand, suspect related to lewy body disease  7. Auditory hallucinations -no mention of these today  8. Morbid obesity ([place]) -is down 9 lbs from last visit--must lose weight to get glucose controlled and prevent stroke or MI  Labs/tests ordered:   Lab Orders  No laboratory test(s) ordered today   Orders Placed This Encounter  Procedures  . Ambulatory referral to Physical Therapy    Referral Priority:   Routine    Referral Type:   Physical Medicine    Referral Reason:   Specialty Services Required    Requested Specialty:   Physical Therapy    Number of Visits Requested:   1  . Ambulatory referral to Cardiology    Referral Priority:   Routine    Referral Type:   Consultation    Referral Reason:   Specialty Services Required    Requested Specialty:   Cardiology    Number of Visits Requested:   1  . EKG 12-Lead     Next appt:  2 mos f/u diabetes    Oyuki Hogan L. Olaf Mesa, D.O. [facility] [address], [place]  [address] Cell Phone (Mon-Fri 8am-5pm):  [phone] On Call:  [phone] & follow prompts after 5pm & weekends Office Phone:  [phone] Office Fax:  [phone]

## 2019-06-19 ENCOUNTER — Other Ambulatory Visit: Payer: Self-pay | Admitting: *Deleted

## 2019-06-19 MED ORDER — LISINOPRIL 20 MG PO TABS: 20.0000 mg | ORAL_TABLET | Freq: Every day | ORAL | 1 refills | Status: DC

## 2019-06-19 MED ORDER — GLUCOSE BLOOD VI STRP: ORAL_STRIP | 3 refills | Status: AC

## 2019-06-19 NOTE — Telephone Encounter (Signed)
Patient wife requested refills.  

## 2019-06-23 ENCOUNTER — Other Ambulatory Visit: Payer: Self-pay | Admitting: Internal Medicine

## 2019-06-23 DIAGNOSIS — F02818 Dementia in other diseases classified elsewhere, unspecified severity, with other behavioral disturbance: Secondary | ICD-10-CM

## 2019-06-23 DIAGNOSIS — F0281 Dementia in other diseases classified elsewhere with behavioral disturbance: Secondary | ICD-10-CM

## 2019-07-01 DIAGNOSIS — C61 Malignant neoplasm of prostate: Secondary | ICD-10-CM | POA: Diagnosis not present

## 2019-08-12 ENCOUNTER — Ambulatory Visit: Payer: BC Managed Care – PPO | Admitting: Internal Medicine

## 2019-08-13 ENCOUNTER — Ambulatory Visit: Payer: Self-pay | Admitting: Cardiovascular Disease

## 2019-08-18 ENCOUNTER — Other Ambulatory Visit: Payer: Self-pay | Admitting: Internal Medicine

## 2019-08-18 NOTE — Progress Notes (Signed)
Cardiology Office Note:   Date:  08/19/2019  NAME:  Brandon [name]    MRN: [id number] DOB:  February 23, 1956   PCP:  Gayland [name], DO  Cardiologist:  Evalina Field, MD   Referring MD: Gayland [name], DO   Chief Complaint  Patient presents with   Loss of Consciousness   History of Present Illness:   Brandon [name] is a 63 y.o. male with a hx of morbid obesity, paroxysmal fibrillation, dementia who is being seen today for the evaluation of syncope at the request of Brandon Kinnier, MD. he presents for evaluation of 2 episodes of syncope.  In June of this year, he reports he had an episode where he blacked out.  He reports his children found him in the driveway and he was unaware of how long he had been out.  He reports he may have felt a little lightheaded before the event.  He reports no prodrome of chest pain, shortness of breath, palpitations prior to the episode.  He went to the emergency room and he reports no cardiovascular work-up was pursued.  He was found to have a UTI and his symptoms were attributed to this.  He presents with his daughter today who is a Designer, jewellery, who is a very good source of his history.  There appears to be a questionable diagnosis of dementia, with concern for Parkinson's versus Lewy body dementia.  He is reported to have had a episode of falling off a ladder nearly 2 weeks before the second episode.  Over the past 4 to 6 months he reports occasional tightness in his chest.  The pain appears to come and go without identified trigger and is alleviated by nitroglycerin.  Of note he had a cardiac catheterization in 2014 that was extremely normal.  As far as I can tell, he has had no further cardiac work-up since that time.  Additionally during the profound episode of syncope he was reported to have had an episode of atrial fibrillation in the field reported by EMS.  This was not confirmed on ECG at the hospital or on telemetry monitoring.  He was started on Eliquis for  this.  He appears of had no further recurrence of an irregular arrhythmia to suggest atrial fibrillation.  His ECG does not show A. fib today.  He has a history of morbid obesity and there is concern for undiagnosed sleep apnea.  The history is quite difficult as Brandon [name] is unable to recall specific events.  Past Medical History: Past Medical History:  Diagnosis Date   B12 deficiency    Per New Patient Packet    Barrett esophagus    Per New Patient Packet    Depression    Per New Patient Packet    Diabetes [facility])    Per New Patient Packet    Fatty liver    Per New Patient Packet    GERD (gastroesophageal reflux disease)    Gout    Per New Patient Packet    History of cardiac cath 2014   Dr.Berry, Per New Patient Packet    History of esophagogastroduodenoscopy (EGD)    Per New Patient Packet    History of tobacco use    HLD (hyperlipidemia)    HOH (hard of hearing)    Per New Patient Packet    HTN (hypertension)    Memory loss    Per New Patient Packet    Numbness and tingling of both feet  Per New Patient Packet    Obesity    OSA (obstructive sleep apnea)    Per New Patient Packet, No CPAP   Prostate cancer [facility])    Per New Patient Packet    Rheumatoid arthritis [facility])    Per New Patient Packet    Tremor    Per New Patient Packet    Unsteady gait    Per New Patient Packet     Past Surgical History: Past Surgical History:  Procedure Laterality Date   KNEE SURGERY Right    Per New Patient Packet    LEFT HEART CATHETERIZATION WITH CORONARY ANGIOGRAM N/A 06/04/2013   Procedure: LEFT HEART CATHETERIZATION WITH CORONARY ANGIOGRAM;  Surgeon: Lorretta Harp, MD;  Location: [facility] C[facility] LAB;  Service: Cardiovascular;  Laterality: N/A;   TRANSURETHRAL RESECTION OF PROSTATE  05/23/2018   Per New Patient Packet, [place]    UPPER GASTROINTESTINAL ENDOSCOPY  2001   Per New Patient Packet    VASECTOMY  1984   Per New Patient Packet     Current  Medications: Current Meds  Medication Sig   allopurinol (ZYLOPRIM) 300 MG tablet Take 300 mg by mouth daily.   apixaban (ELIQUIS) 5 MG TABS tablet Take 1 tablet (5 mg total) by mouth 2 (two) times daily.   atorvastatin (LIPITOR) 40 MG tablet Take 40 mg by mouth daily.   buPROPion (WELLBUTRIN XL) 300 MG 24 hr tablet Take 1 tablet (300 mg total) by mouth daily.   dexlansoprazole (DEXILANT) 60 MG capsule Take 60 mg by mouth daily as needed.   glipiZIDE (GLUCOTROL XL) 5 MG 24 hr tablet Take 5 mg by mouth daily with breakfast.   glucose blood test strip One Touch Verio Test Strips. Use to test blood sugar twice daily or as directed. Dx: E11.40   lisinopril (ZESTRIL) 20 MG tablet Take 1 tablet (20 mg total) by mouth daily.   LORazepam (ATIVAN) 0.5 MG tablet Take 0.5 tablets (0.25 mg total) by mouth 3 (three) times daily as needed for anxiety.   metFORMIN (GLUCOPHAGE) 1000 MG tablet Take 1,000 mg by mouth 2 (two) times daily with a meal.   nitroGLYCERIN (NITROSTAT) 0.4 MG SL tablet Place 0.4 mg under the tongue every 5 (five) minutes as needed for chest pain.   pantoprazole (PROTONIX) 40 MG tablet Take 40 mg by mouth daily as needed.   tiZANidine (ZANAFLEX) 2 MG tablet TAKE 1 OR 2 TABLETS 3 TIMES DAILY   traMADol-acetaminophen (ULTRACET) 37.5-325 MG tablet TAKE 1 TABLET 4 TIMES DAILY AS NEEDED FOR PAIN   TRULICITY 1.5 0000000 SOPN Inject 1.5 mg into the skin every 7 (seven) days.   Turmeric Curcumin 500 MG CAPS Take by mouth daily.   vitamin B-12 (CYANOCOBALAMIN) 1000 MCG tablet Take 1,000 mcg by mouth daily.      Allergies:    Bee venom   Social History: Social History   Socioeconomic History   Marital status: Married    Spouse name: Not on file   Number of children: Not on file   Years of education: Not on file   Highest education level: Not on file  Occupational History   Not on file  Social Needs   Financial resource strain: Not on file   Food insecurity     Worry: Not on file    Inability: Not on file   Transportation needs    Medical: Not on file    Non-medical: Not on file  Tobacco Use   Smoking status: Former  Smoker    Types: Cigarettes, Pipe    Quit date: 12/19/2013    Years since quitting: 5.6   Smokeless tobacco: Never Used  Substance and Sexual Activity   Alcohol use: Yes    Comment: Occasional    Drug use: Never   Sexual activity: Not on file  Lifestyle   Physical activity    Days per week: Not on file    Minutes per session: Not on file   Stress: Not on file  Relationships   Social connections    Talks on phone: Not on file    Gets together: Not on file    Attends religious service: Not on file    Active member of club or organization: Not on file    Attends meetings of clubs or organizations: Not on file    Relationship status: Not on file  Other Topics Concern   Not on file  Social History Narrative   Diet: High Fat, Carbs, and Sweets       Caffeine: Coffee      Married, if yes what year: Yes, 1977       Do you live in a house, apartment, assisted living, condo, trailer, ect: House, 2 stories, 3 dogs   Pets:      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 No, putting on socks are difficult   Do you have difficulty preparing food or eating? No   Do you have difficulty managing your medications? Yes   Do you have difficulty managing your finances? Yes, recently    Do you have difficulty affording your medications? No     Family History: The patient's family history includes Alcohol abuse in his brother; Anxiety disorder in his daughter and mother; Atrial fibrillation in his mother; Bipolar disorder in his daughter; Conductive hearing loss in his daughter; Coronary artery disease (age of onset: 45) in his father;  Dementia in his mother; Depression in his daughter and mother; Hepatitis C in his mother; High Cholesterol in his brother and daughter; Hypertension in his brother, father, and mother; Hypothyroidism in his mother; Obesity in his brother and daughter; Prostate cancer in his father; Rheum arthritis in his mother; Vitamin D deficiency in his daughter.  ROS:   All other ROS reviewed and negative. Pertinent positives noted in the HPI.     EKGs/Labs/Other Studies Reviewed:   The following studies were personally reviewed by me today: Total cholesterol 111 HDL 45 LDL 36 A1c 8.9  Left heart cath 2014 1. Left main; normal  2. LAD; normal 3. Left circumflex; normal.  4. Right coronary artery; dominant and normal 5. Left ventriculography; RAO left ventriculogram was performed using  25 mL of Visipaque dye at 12 mL/second. The overall LVEF estimated  60 %  Without wall motion abnormalities  EKG:  EKG is ordered today.  The ekg ordered today demonstrates normal sinus rhythm, heart rate 91, low voltage noted in precordial leads, no prior infarct, no acute ST-T changes to suggest ischemia.  Recent Labs: 05/24/2019: ALT 28; BUN 25; Creat 1.11; Hemoglobin 14.8; Platelets 254; Potassium 4.4; Sodium 138   Recent Lipid Panel    Component Value Date/Time   CHOL 111 05/24/2019 1131   TRIG 240 (H) 05/24/2019 1131  HDL 45 05/24/2019 1131   CHOLHDL 2.5 05/24/2019 1131   LDLCALC 36 05/24/2019 1131    Physical Exam:   VS:  BP (!) 150/92    Pulse 93    Temp (!) 96.6 F (35.9 C)    Ht 5\' 9"  (1.753 m)    Wt (!) 304 lb 3.2 oz (138 kg)    SpO2 93%    BMI 44.92 kg/m    Wt Readings from Last 3 Encounters:  08/19/19 (!) 304 lb 3.2 oz (138 kg)  06/10/19 (!) 326 lb (147.9 kg)  01/28/19 (!) 335 lb (152 kg)    General: Obese male, no acute distress Heart: Atraumatic, normal size  Eyes: PEERLA, EOMI  Neck: Supple, no JVD Endocrine: No thryomegaly Cardiac: Normal S1, S2; RRR; no murmurs, rubs, or  gallops Lungs: Clear to auscultation bilaterally, no wheezing, rhonchi or rales  Abd: Soft, nontender, no hepatomegaly  Ext: No edema, pulses 2+, venous insufficiency changes noted in lower extremity Musculoskeletal: No deformities, BUE and BLE strength normal and equal Skin: Warm and dry, no rashes   Neuro: Alert and oriented to person, place, time, and situation, CNII-XII grossly intact, no focal deficits  Psych: Normal mood and affect   ASSESSMENT:   NAME@ is a 63 y.o. male who presents for the following: 1. Syncope, unspecified syncope type   2. Other chest pain   3. Paroxysmal atrial fibrillation (HCC)   4. Obesity, morbid, BMI 40.0-49.9 (Brandon [name])     PLAN:   1. Syncope, unspecified syncope type 2. Other chest pain -He presents with a history of profound syncope.  The details of the event are quite cloudy as Brandon [name] has memory impairment.  He possibly reports being lightheaded and nausea prior to the event.  This likely suggests he had a vasovagal event.  However, given his significant risk factors for coronary disease I cannot exclude underlying ischemia or malignant arrhythmia at this time.  He has been instructed to not drive until we complete his work-up.  I will proceed with a transthoracic echocardiogram to ensure his heart structurally normal.  Will obtain a Lexiscan stress test to ensure there is no inducible ischemia.  I will also place a 2-week zio patch to look for underlying atrial fibrillation a possible contributor to his symptoms. -I have also encouraged he and his daughter to continue to follow with neurology.  There is concerns for Parkinson's disease versus Lewy body dementia.  Both conditions are known to be associated with neurogenic orthostatic hypotension, which could be contributing to his symptoms. -I have also encouraged him to follow-up with his primary care physician for better control of his diabetes.  Although no hypoglycemia was reported per his daughter  fluctuations in his blood sugar could explain his symptoms as well. -We will see him back in 4 to 6 weeks after the above cardiac work-up.  If he has no further symptoms in the interim and his tests are normal I will clear him to drive  3. Paroxysmal atrial fibrillation (HCC) -There is a EMS report of possible atrial fibrillation, but no further documented evidence -I am highly suspicious that he actually never had atrial fibrillation -For now after discussion with his daughter we will stop his Eliquis and obtain a 2-week zio patch to determine if he has any arrhythmias -I think it is reasonable to watch and wait to see if he actually does declare himself as having atrial fibrillation before committing him to anticoagulation  4. Obesity, morbid, BMI  40.0-49.9 ([place]) -Counseled the importance of weight loss and exercise  Disposition: Return in about 6 weeks (around 09/30/2019).  Medication Adjustments/Labs and Tests Ordered: Current medicines are reviewed at length with the patient today.  Concerns regarding medicines are outlined above.  Orders Placed This Encounter  Procedures   Myocardial Perfusion Imaging   LONG TERM MONITOR-LIVE TELEMETRY (3-14 DAYS)   EKG 12-Lead   ECHOCARDIOGRAM COMPLETE   No orders of the defined types were placed in this encounter.   Patient Instructions  Medication Instructions:  Stop Eliquis   Lab work: None ordered  Testing/Procedures: Schedule Lexiscan Myoview  Schedule Echo  Schedule 14 day Zio Patch Monitor  Follow-Up: At [facility], you and your health needs are our priority.  As part of our continuing mission to provide you with exceptional heart care, we have created designated Provider Care Teams.  These Care Teams include your primary Cardiologist (physician) and Advanced Practice Providers (APPs -  Physician Assistants and Nurse Practitioners) who all work together to provide you with the care you need, when you need  it.  Schedule follow Up Appointment in 4 to 6 weeks after tests.  NO DRIVING      Signed, Lake Bells T. Audie Box, [place]  [address] [place], [place] [address] [phone]  08/19/2019 2:00 PM

## 2019-08-19 ENCOUNTER — Encounter: Payer: Self-pay | Admitting: Cardiovascular Disease

## 2019-08-19 ENCOUNTER — Other Ambulatory Visit: Payer: Self-pay

## 2019-08-19 ENCOUNTER — Ambulatory Visit: Payer: BC Managed Care – PPO | Admitting: Cardiovascular Disease

## 2019-08-19 VITALS — BP 150/92 | HR 93 | Temp 96.6°F | Ht 69.0 in | Wt 304.2 lb

## 2019-08-19 DIAGNOSIS — I480 Paroxysmal atrial fibrillation: Secondary | ICD-10-CM | POA: Diagnosis not present

## 2019-08-19 DIAGNOSIS — R55 Syncope and collapse: Secondary | ICD-10-CM

## 2019-08-19 DIAGNOSIS — R0789 Other chest pain: Secondary | ICD-10-CM | POA: Diagnosis not present

## 2019-08-19 NOTE — Addendum Note (Signed)
Addended by: Kathyrn Lass on: 08/19/2019 02:10 PM   Modules accepted: Orders

## 2019-08-19 NOTE — Patient Instructions (Signed)
Medication Instructions:  Stop Eliquis   Lab work: None ordered  Testing/Procedures: Schedule Lexiscan Myoview  Schedule Echo  Schedule 14 day Zio Patch Monitor  Follow-Up: At [facility], you and your health needs are our priority.  As part of our continuing mission to provide you with exceptional heart care, we have created designated Provider Care Teams.  These Care Teams include your primary Cardiologist (physician) and Advanced Practice Providers (APPs -  Physician Assistants and Nurse Practitioners) who all work together to provide you with the care you need, when you need it. . Schedule follow Up Appointment in 4 to 6 weeks after tests.  NO DRIVING

## 2019-08-20 ENCOUNTER — Other Ambulatory Visit: Payer: Self-pay | Admitting: Internal Medicine

## 2019-08-20 ENCOUNTER — Other Ambulatory Visit: Payer: Self-pay

## 2019-08-20 ENCOUNTER — Telehealth: Payer: Self-pay | Admitting: *Deleted

## 2019-08-20 DIAGNOSIS — G3183 Dementia with Lewy bodies: Secondary | ICD-10-CM

## 2019-08-20 DIAGNOSIS — R55 Syncope and collapse: Secondary | ICD-10-CM

## 2019-08-20 DIAGNOSIS — I480 Paroxysmal atrial fibrillation: Secondary | ICD-10-CM

## 2019-08-20 DIAGNOSIS — F0281 Dementia in other diseases classified elsewhere with behavioral disturbance: Secondary | ICD-10-CM

## 2019-08-20 NOTE — Telephone Encounter (Signed)
Insurance does not cover ZIO AT and it cannot be removed for Echo or Nuc Med tests.  ZIO will not supply an additional patch without ordering a second test. [employer] 14 day cardiac event monitor to be shipped to patients home instead.  Instructions reviewed.

## 2019-08-20 NOTE — Telephone Encounter (Signed)
14 day ZIO AT long term live telemetry monitor to be mailed to patients home.  Instructions reviewed briefly as they are included in the monitor kit.  The Zio patch is not designed to be removed and reapplied.  Patient is scheduled for an echocardiogram and Nuclear Medicine study , which, may require the patch be removed.  I have requested a second patch be sent with the patients kit.  If it is not included but needed, please call [employer] at [phone].

## 2019-08-21 ENCOUNTER — Telehealth: Payer: Self-pay | Admitting: Cardiovascular Disease

## 2019-08-21 NOTE — Telephone Encounter (Signed)
Brandon [name]   08/20/19 5:50 PM Note Insurance does not cover ZIO AT and it cannot be removed for Echo or Nuc Med tests.  [employer] will not supply an additional patch without ordering [name] second test. [employer] 14 day cardiac event monitor to be shipped to patients home instead.  Instructions reviewed. LMVM for [name] Post that we ordered [name] monitor that was covered and disregard order

## 2019-08-21 NOTE — Telephone Encounter (Signed)
°  Will calling from [employer] to inform staff insurance will not cover monitor  Phone [phone] [name] Post, ok to leave message Customer service [phone] to get options for payment and cost

## 2019-08-23 ENCOUNTER — Other Ambulatory Visit (HOSPITAL_COMMUNITY): Payer: BC Managed Care – PPO

## 2019-08-26 ENCOUNTER — Ambulatory Visit (INDEPENDENT_AMBULATORY_CARE_PROVIDER_SITE_OTHER): Payer: BC Managed Care – PPO

## 2019-08-26 DIAGNOSIS — I480 Paroxysmal atrial fibrillation: Secondary | ICD-10-CM

## 2019-08-26 DIAGNOSIS — R55 Syncope and collapse: Secondary | ICD-10-CM | POA: Diagnosis not present

## 2019-08-28 ENCOUNTER — Telehealth (HOSPITAL_COMMUNITY): Payer: Self-pay

## 2019-08-28 NOTE — Telephone Encounter (Signed)
Encounter complete. 

## 2019-08-29 ENCOUNTER — Ambulatory Visit (HOSPITAL_COMMUNITY): Payer: BC Managed Care – PPO | Attending: Internal Medicine

## 2019-08-29 ENCOUNTER — Encounter: Payer: Self-pay | Admitting: Internal Medicine

## 2019-08-29 ENCOUNTER — Other Ambulatory Visit: Payer: Self-pay

## 2019-08-29 ENCOUNTER — Ambulatory Visit (INDEPENDENT_AMBULATORY_CARE_PROVIDER_SITE_OTHER): Payer: BC Managed Care – PPO | Admitting: Internal Medicine

## 2019-08-29 VITALS — BP 150/98 | HR 83 | Temp 97.5°F | Resp 22 | Ht 69.0 in | Wt 325.0 lb

## 2019-08-29 DIAGNOSIS — F02818 Dementia in other diseases classified elsewhere, unspecified severity, with other behavioral disturbance: Secondary | ICD-10-CM

## 2019-08-29 DIAGNOSIS — I480 Paroxysmal atrial fibrillation: Secondary | ICD-10-CM | POA: Diagnosis not present

## 2019-08-29 DIAGNOSIS — E1140 Type 2 diabetes mellitus with diabetic neuropathy, unspecified: Secondary | ICD-10-CM

## 2019-08-29 DIAGNOSIS — R55 Syncope and collapse: Secondary | ICD-10-CM | POA: Diagnosis not present

## 2019-08-29 DIAGNOSIS — Z23 Encounter for immunization: Secondary | ICD-10-CM | POA: Diagnosis not present

## 2019-08-29 DIAGNOSIS — F0281 Dementia in other diseases classified elsewhere with behavioral disturbance: Secondary | ICD-10-CM

## 2019-08-29 DIAGNOSIS — L723 Sebaceous cyst: Secondary | ICD-10-CM

## 2019-08-29 DIAGNOSIS — R0789 Other chest pain: Secondary | ICD-10-CM | POA: Diagnosis not present

## 2019-08-29 DIAGNOSIS — G3183 Dementia with Lewy bodies: Secondary | ICD-10-CM

## 2019-08-29 DIAGNOSIS — F321 Major depressive disorder, single episode, moderate: Secondary | ICD-10-CM | POA: Insufficient documentation

## 2019-08-29 MED ORDER — NITROGLYCERIN 0.4 MG SL SUBL: 0.4000 mg | SUBLINGUAL_TABLET | SUBLINGUAL | 1 refills | Status: AC | PRN

## 2019-08-29 MED ORDER — TRAMADOL HCL 50 MG PO TABS: 50.0000 mg | ORAL_TABLET | Freq: Every day | ORAL | 0 refills | Status: AC

## 2019-08-29 MED ORDER — DEXILANT 60 MG PO CPDR: 60.0000 mg | DELAYED_RELEASE_CAPSULE | Freq: Every day | ORAL | 3 refills | Status: DC | PRN

## 2019-08-29 MED ORDER — PERFLUTREN LIPID MICROSPHERE
1.0000 mL | INTRAVENOUS | Status: AC | PRN
  Administered 2019-08-29: 2 mL via INTRAVENOUS

## 2019-08-29 NOTE — Progress Notes (Signed)
Location:  [facility]   Advanced Directives 01/28/2019  Does Patient Have a Medical Advance Directive? Yes  Does patient want to make changes to medical advance directive? No - Patient declined  Pre-existing out of facility DNR order (yellow form or pink MOST form) -     Chief Complaint  Patient presents with  . Follow-up    HPI: Patient is a 63 y.o. male seen today for medical management of chronic diseases.    He has been suffering from syncope. In total he has had 4 episodes. Followed by cardiology and has been using a holter monitor. He had a echocardiogram today. Scheduled to have a nuclear stress test next week. He is out of nitroglycerin tablets due to occasional chest tightness.   He states he is fatigued, and falls asleep often. His wife states he does not exercise and sits in his chair all day. He is able to sleep 6-8 hours a night, but takes 3-4 naps daily. He states he as trouble breathing at night and claims to take sudafed daily.   His shoulder pain and joint pain have not eased. He is taking Tramadol daily, along with Goody's joint pain. He claims he does not exercise because of his joint pain. He is able to ambulate to the mail box with the assistance of a cane. No injuries or falls since last visit.   He is very hard of hearing. He refuses to see a specialist.   He is asking for his flu vaccine today.   His left buttox has a pimple that has grown in size. At times, it is very painful to sit. There is not drainage. Hot compresses help relieve the pain.   His wife states he is becoming more agitated. This is partially related to his hearing loss.   He claims he is depressed about his health and weight. In the past, he use to diet and lose weight easily. He is sad he has gained weight and does not feel "himself."    Past Medical History:  Diagnosis Date  . B12 deficiency    Per New Patient Packet   . Barrett esophagus    Per New Patient Packet   . Depression     Per New Patient Packet   . Diabetes [facility])    Per New Patient Packet   . Fatty liver    Per New Patient Packet   . GERD (gastroesophageal reflux disease)   . Gout    Per New Patient Packet   . History of cardiac cath 2014   Dr.Berry, Per New Patient Packet   . History of esophagogastroduodenoscopy (EGD)    Per New Patient Packet   . History of tobacco use   . HLD (hyperlipidemia)   . HOH (hard of hearing)    Per New Patient Packet   . HTN (hypertension)   . Memory loss    Per New Patient Packet   . Numbness and tingling of both feet    Per New Patient Packet   . Obesity   . OSA (obstructive sleep apnea)    Per New Patient Packet, No CPAP  . Prostate cancer [facility])    Per New Patient Packet   . Rheumatoid arthritis [facility])    Per New Patient Packet   . Tremor    Per New Patient Packet   . Unsteady gait    Per New Patient Packet     Past Surgical History:  Procedure Laterality Date  .  KNEE SURGERY Right    Per New Patient Packet   . LEFT HEART CATHETERIZATION WITH CORONARY ANGIOGRAM N/A 06/04/2013   Procedure: LEFT HEART CATHETERIZATION WITH CORONARY ANGIOGRAM;  Surgeon: Lorretta Harp, MD;  Location: [facility] CATH LAB;  Service: Cardiovascular;  Laterality: N/A;  . TRANSURETHRAL RESECTION OF PROSTATE  05/23/2018   Per New Patient Packet, [place]   . UPPER GASTROINTESTINAL ENDOSCOPY  2001   Per New Patient Packet   . VASECTOMY  1984   Per New Patient Packet     Allergies  Allergen Reactions  . Bee Venom Anaphylaxis    Outpatient Encounter Medications as of 08/29/2019  Medication Sig  . allopurinol (ZYLOPRIM) 300 MG tablet Take 300 mg by mouth daily.  Marland Kitchen atorvastatin (LIPITOR) 40 MG tablet Take 40 mg by mouth daily.  Marland Kitchen buPROPion (WELLBUTRIN XL) 300 MG 24 hr tablet Take 1 tablet (300 mg total) by mouth daily.  Marland Kitchen dexlansoprazole (DEXILANT) 60 MG capsule Take 60 mg by mouth daily as needed.  . donepezil (ARICEPT) 5 MG tablet TAKE 1 TABLET NIGHTLY AT BEDTIME  .  glipiZIDE (GLUCOTROL XL) 5 MG 24 hr tablet Take 5 mg by mouth daily with breakfast.  . glucose blood test strip One Touch Verio Test Strips. Use to test blood sugar twice daily or as directed. Dx: E11.40  . lisinopril (ZESTRIL) 20 MG tablet Take 1 tablet (20 mg total) by mouth daily.  Marland Kitchen LORazepam (ATIVAN) 0.5 MG tablet Take 0.5 tablets (0.25 mg total) by mouth 3 (three) times daily as needed for anxiety.  . metFORMIN (GLUCOPHAGE) 1000 MG tablet Take 1,000 mg by mouth 2 (two) times daily with a meal.  . nitroGLYCERIN (NITROSTAT) 0.4 MG SL tablet Place 0.4 mg under the tongue every 5 (five) minutes as needed for chest pain.  . traMADol-acetaminophen (ULTRACET) 37.5-325 MG tablet TAKE 1 TABLET 4 TIMES DAILY AS NEEDED FOR PAIN  . TRULICITY 1.5 0000000 SOPN Inject 1.5 mg into the skin every 7 (seven) days.  . Turmeric Curcumin 500 MG CAPS Take by mouth daily.  . vitamin B-12 (CYANOCOBALAMIN) 1000 MCG tablet Take 1,000 mcg by mouth daily.   . [DISCONTINUED] ciprofloxacin (CIPRO) 500 MG tablet TAKE 1 TABLET EVERY 12 HOURS FOR 10 DAYS  . [DISCONTINUED] pantoprazole (PROTONIX) 40 MG tablet Take 40 mg by mouth daily as needed.  . [DISCONTINUED] tiZANidine (ZANAFLEX) 2 MG tablet TAKE 1 OR 2 TABLETS 3 TIMES DAILY  . [EXPIRED] perflutren lipid microspheres (DEFINITY) IV suspension    No facility-administered encounter medications on file as of 08/29/2019.     Review of Systems:  Review of Systems  Constitutional: Positive for fatigue. Negative for activity change, appetite change and fever.  HENT: Positive for hearing loss. Negative for dental problem, sore throat and trouble swallowing.   Eyes: Negative for photophobia and visual disturbance.  Respiratory: Positive for chest tightness and shortness of breath. Negative for cough and wheezing.   Cardiovascular: Positive for chest pain, palpitations and leg swelling.  Gastrointestinal: Positive for diarrhea. Negative for abdominal pain, constipation and  nausea.  Endocrine: Negative for polydipsia, polyphagia and polyuria.  Genitourinary: Negative for dysuria and hematuria.  Musculoskeletal: Positive for arthralgias, gait problem, joint swelling, myalgias and neck pain.       Left shoulder pain  Skin:       Boil on buttox  Neurological: Positive for tremors, syncope and numbness. Negative for headaches.  Hematological: Bruises/bleeds easily.  Psychiatric/Behavioral: Positive for agitation. Negative for dysphoric mood and suicidal ideas.  The patient is not nervous/anxious.     Health Maintenance  Topic Date Due  . Hepatitis C Screening  15-Mar-1956  . HIV Screening  12/15/1971  . COLONOSCOPY  12/14/2006  . INFLUENZA VACCINE  07/20/2019  . TETANUS/TDAP  09/18/2024    Physical Exam: Vitals:   08/29/19 1258  BP: (!) 150/98  Pulse: 83  Resp: (!) 22  Temp: (!) 97.5 F (36.4 C)  TempSrc: Oral  SpO2: 97%  Weight: (!) 362 lb 6.4 oz (164.4 kg)  Height: 5\' 9"  (1.753 m)   Body mass index is 53.52 kg/m. Physical Exam Vitals signs reviewed.  Constitutional:      Appearance: Normal appearance.  HENT:     Right Ear: There is no impacted cerumen.     Left Ear: There is no impacted cerumen.     Ears:     Comments: Hard of hearing during examination Neck:     Musculoskeletal: Normal range of motion. Muscular tenderness present.     Vascular: No carotid bruit.  Cardiovascular:     Rate and Rhythm: Normal rate and regular rhythm.     Pulses: Normal pulses.          Dorsalis pedis pulses are 2+ on the right side and 2+ on the left side.     Heart sounds: Normal heart sounds. No murmur.  Pulmonary:     Effort: Pulmonary effort is normal. No respiratory distress.     Breath sounds: Normal breath sounds. No wheezing.  Abdominal:     General: Bowel sounds are normal.     Palpations: Abdomen is soft.     Tenderness: There is no abdominal tenderness.  Musculoskeletal:        General: Tenderness present.     Right lower leg: Edema  present.     Left lower leg: Edema present.     Right foot: Normal range of motion.     Left foot: Normal range of motion.  Feet:     Right foot:     Skin integrity: Skin integrity normal.     Toenail Condition: Fungal disease present.    Left foot:     Skin integrity: Skin integrity normal.     Toenail Condition: Fungal disease present.    Comments: Monofilament test sensation 10/10 on left foot. 8/10 sensation on right foot.  Lymphadenopathy:     Cervical: No cervical adenopathy.  Skin:    General: Skin is warm.     Capillary Refill: Capillary refill takes 2 to 3 seconds.       Neurological:     General: No focal deficit present.     Mental Status: He is alert and oriented to person, place, and time.     Gait: Gait abnormal.  Psychiatric:        Mood and Affect: Mood normal.        Behavior: Behavior normal.        Thought Content: Thought content normal.        Judgment: Judgment normal.     Labs reviewed: Basic Metabolic Panel: Recent Labs    12/07/18 05/24/19 1131  NA 136* 138  K 4.2 4.4  CL  --  103  CO2  --  22  GLUCOSE  --  168*  BUN 16 25  CREATININE 0.9 1.11  CALCIUM  --  9.2   Liver Function Tests: Recent Labs    12/07/18 05/24/19 1131  AST 16 21  ALT 22 28  ALKPHOS 70  --  BILITOT  --  0.8  PROT  --  6.9   No results for input(s): LIPASE, AMYLASE in the last 8760 hours. No results for input(s): AMMONIA in the last 8760 hours. CBC: Recent Labs    05/24/19 1131  WBC 9.1  NEUTROABS 5,669  HGB 14.8  HCT 44.5  MCV 88.3  PLT 254   Lipid Panel: Recent Labs    05/24/19 1131  CHOL 111  HDL 45  LDLCALC 36  TRIG 240*  CHOLHDL 2.5   Lab Results  Component Value Date   HGBA1C 8.9 (H) 05/24/2019    Procedures since last visit: No results found.  Assessment/Plan 1. Type 2 diabetes mellitus with diabetic neuropathy, without long-term current use of insulin (HCC) - he continues to eat foods high in salt, fat, carbs and refined sugar  - Continue current medication regimen - encourage stricter diet - hemogloubin A1C-today - complete metabolic panel with GFR- today  2. Lewy body dementia with behavioral disturbance ([place]) - he appears to become frustrated when he cannot hear, he refused to have his hearing assessed - no reported hallucinations - Judgement was appropriate today, do not think his condition has progressed - continue current donezepril 5 mg tablet  3. Depression, major, single episode, moderate ([place]) - he claims his depression is related to his current health conditions - he does not have a plan to hurt himself - continue current wellbutrin  4. Morbid obesity ([place]) - his weight has not changed from last visit - after cardiology nuclear stress test is complete, consider referral to [facility] weight management clinic -Recommend stricter diet limiting carbs, sugar, salt and fats 5. Syncope, unspecified syncope type - being followed by cardiology - wearing holter monitor to capture episodes - discontinue taking sudafed and goodys powder  6. Inflamed sebaceous cyst - cyst 1/1/2 cm in size, red, and feels encapsulated - recommend warm compress - referral to dermatology  7. Need for influenza vaccination - administer fluad quad (high dose 65+)  Labs/tests ordered:  Complete metabolic panel with GFR, hemoglobin A1C- today Next appt:  8 week follow up

## 2019-08-29 NOTE — Patient Instructions (Signed)
If it's not clear how to use the heart monitor, contact the cardiology office.  Best wishes with your cardiology stress test.  I'll be on the lookout for results.    Stop using goody's powder and stop taking sudafed.  These are both bad for you.  Use tramadol at bedtime and you may take tylenol up to maximum of 3000mg  total per day.  You may also use the cream that Brandon [name] got you.  Losing weight and getting a good night's sleep should help with your osteoarthritis (joint pains).  Continue warm compresses on your left buttocks until you can be seen by dermatology.  Continue to work on your diet with low carb (starches, sweets).  We'll see on today's labs if your sugar average has improved.

## 2019-08-30 LAB — BASIC METABOLIC PANEL
BUN: 18 mg/dL (ref 7–25)
CO2: 24 mmol/L (ref 20–32)
Calcium: 9.6 mg/dL (ref 8.6–10.3)
Chloride: 104 mmol/L (ref 98–110)
Creat: 1.02 mg/dL (ref 0.70–1.25)
Glucose, Bld: 224 mg/dL — ABNORMAL HIGH (ref 65–139)
Potassium: 3.9 mmol/L (ref 3.5–5.3)
Sodium: 139 mmol/L (ref 135–146)

## 2019-08-30 LAB — HEMOGLOBIN A1C
Hgb A1c MFr Bld: 8.2 % of total Hgb — ABNORMAL HIGH (ref <5.7)
Mean Plasma Glucose: 189 (calc)
eAG (mmol/L): 10.4 (calc)

## 2019-09-03 ENCOUNTER — Encounter (HOSPITAL_COMMUNITY): Payer: BC Managed Care – PPO

## 2019-09-03 ENCOUNTER — Ambulatory Visit (HOSPITAL_COMMUNITY)
Admission: RE | Admit: 2019-09-03 | Discharge: 2019-09-03 | Disposition: A | Discharge status: Home / Self Care | Payer: BC Managed Care – PPO | Source: Ambulatory Visit | Attending: Cardiovascular Disease | Admitting: Cardiovascular Disease

## 2019-09-03 ENCOUNTER — Other Ambulatory Visit: Payer: Self-pay

## 2019-09-03 DIAGNOSIS — R0789 Other chest pain: Secondary | ICD-10-CM | POA: Insufficient documentation

## 2019-09-03 DIAGNOSIS — I480 Paroxysmal atrial fibrillation: Secondary | ICD-10-CM | POA: Diagnosis not present

## 2019-09-03 DIAGNOSIS — R55 Syncope and collapse: Secondary | ICD-10-CM | POA: Diagnosis not present

## 2019-09-03 MED ORDER — TECHNETIUM TC 99M TETROFOSMIN IV KIT
29.8000 | PACK | Freq: Once | INTRAVENOUS | Status: AC | PRN
  Administered 2019-09-03: 29.8 via INTRAVENOUS
  Filled 2019-09-03: qty 30

## 2019-09-03 MED ORDER — REGADENOSON 0.4 MG/5ML IV SOLN
0.4000 mg | Freq: Once | INTRAVENOUS | Status: AC
  Administered 2019-09-03: 0.4 mg via INTRAVENOUS

## 2019-09-04 ENCOUNTER — Ambulatory Visit (HOSPITAL_COMMUNITY)
Admission: RE | Admit: 2019-09-04 | Discharge: 2019-09-04 | Disposition: A | Discharge status: Home / Self Care | Payer: BC Managed Care – PPO | Source: Ambulatory Visit | Attending: Cardiovascular Disease | Admitting: Cardiovascular Disease

## 2019-09-04 LAB — MYOCARDIAL PERFUSION IMAGING
Peak HR: 96 {beats}/min
Rest HR: 79 {beats}/min

## 2019-09-04 MED ORDER — TECHNETIUM TC 99M TETROFOSMIN IV KIT
28.0000 | PACK | Freq: Once | INTRAVENOUS | Status: AC | PRN
  Administered 2019-09-04: 28 via INTRAVENOUS

## 2019-09-27 ENCOUNTER — Ambulatory Visit: Payer: BC Managed Care – PPO | Admitting: Cardiovascular Disease

## 2019-10-08 ENCOUNTER — Other Ambulatory Visit: Payer: Self-pay | Admitting: Internal Medicine

## 2019-10-08 DIAGNOSIS — I480 Paroxysmal atrial fibrillation: Secondary | ICD-10-CM

## 2019-10-28 ENCOUNTER — Ambulatory Visit: Payer: BC Managed Care – PPO | Admitting: Internal Medicine

## 2019-11-25 ENCOUNTER — Ambulatory Visit: Payer: BC Managed Care – PPO | Admitting: Internal Medicine

## 2019-12-02 ENCOUNTER — Other Ambulatory Visit: Payer: Self-pay

## 2019-12-02 ENCOUNTER — Encounter: Payer: Self-pay | Admitting: Internal Medicine

## 2019-12-02 ENCOUNTER — Ambulatory Visit (INDEPENDENT_AMBULATORY_CARE_PROVIDER_SITE_OTHER): Payer: BC Managed Care – PPO | Admitting: Internal Medicine

## 2019-12-02 VITALS — BP 130/80 | HR 74 | Temp 97.7°F | Ht 69.0 in | Wt 325.0 lb

## 2019-12-02 DIAGNOSIS — C61 Malignant neoplasm of prostate: Secondary | ICD-10-CM

## 2019-12-02 DIAGNOSIS — F0281 Dementia in other diseases classified elsewhere with behavioral disturbance: Secondary | ICD-10-CM

## 2019-12-02 DIAGNOSIS — K2100 Gastro-esophageal reflux disease with esophagitis, without bleeding: Secondary | ICD-10-CM

## 2019-12-02 DIAGNOSIS — L723 Sebaceous cyst: Secondary | ICD-10-CM

## 2019-12-02 DIAGNOSIS — R1011 Right upper quadrant pain: Secondary | ICD-10-CM

## 2019-12-02 DIAGNOSIS — E1140 Type 2 diabetes mellitus with diabetic neuropathy, unspecified: Secondary | ICD-10-CM | POA: Diagnosis not present

## 2019-12-02 DIAGNOSIS — G3183 Dementia with Lewy bodies: Secondary | ICD-10-CM

## 2019-12-02 DIAGNOSIS — Z6841 Body Mass Index (BMI) 40.0 and over, adult: Secondary | ICD-10-CM

## 2019-12-02 DIAGNOSIS — K22719 Barrett's esophagus with dysplasia, unspecified: Secondary | ICD-10-CM

## 2019-12-02 MED ORDER — DEXILANT 60 MG PO CPDR: 60.0000 mg | DELAYED_RELEASE_CAPSULE | Freq: Every day | ORAL | 3 refills | Status: DC | PRN

## 2019-12-02 MED ORDER — DOXYCYCLINE HYCLATE 100 MG PO TABS: 100.0000 mg | ORAL_TABLET | Freq: Two times a day (BID) | ORAL | 0 refills | Status: DC

## 2019-12-02 NOTE — Patient Instructions (Addendum)
Make another visit with [place] and we'll open the cyst on your behind.  Please keep dermatology appointment.    Stop taking goody's powder which upsets the stomach.  We'll get a picture of your right upper quadrant (ultrasound).

## 2019-12-02 NOTE — Progress Notes (Signed)
Location:  [facility] clinic  Provider: Dr. Hollace Kinnier  Goals of Care:  Advanced Directives 01/28/2019  Does Patient Have a Medical Advance Directive? Yes  Does patient want to make changes to medical advance directive? No - Patient declined  Pre-existing out of facility DNR order (yellow form or pink MOST form) -     Chief Complaint  Patient presents with  . Medical Management of Chronic Issues    8 week follow-up    HPI: Patient is a 62 y.o. male seen today for medical management of chronic diseases.    Daughter present during encounter.   He is still driving. He is having trouble remembering where he is going. He is starting to miss appointments due to this factor. Today he followed his daughter to the appointment today.   Gallbladder issues have progressed. He is having diarrhea about 30 minutes after he eats. He will also have nausea at times when he has diarrhea. He thinks fatty foods help relieve the abdominal pain at times. RUQ is painful.   He is still having a poor diet due to his memory issues.   He is out of dexilant and has been taking protonix. He has been having heartburn in the late evening. Dinnertime varies everyday. He has purchased a new pillow to help keep him upright.   He states he is taking his medicine daily, but will sometimes forget and take double doses of medicine. He will not take his pills out of the pill box.   This is a sad time of year for him because his father passes away. His mother is still alive and in a nursing home. He is worried about his mother getting covid.   He gets frustrated and has outbursts 1-3 times a week. Having night terrors or vivid dreams weekly. 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 Daughter concerned he has PTSD.   He is still having fragmented sleep. He is also having irregular sleeping times.   He is still having trouble with his skin. He never followed up with dermatology from last visit.   No  recent injuries or falls. Ambulates with cane.   Recently went to eye doctor for diabetic eye exam. Will need cataracts surgery in the future.   No recent episodes of gout.   He is still having headaches and using [employer].   Requesting PSA lab.      Past Medical History:  Diagnosis Date  . B12 deficiency    Per New Patient Packet   . Barrett esophagus    Per New Patient Packet   . Depression    Per New Patient Packet   . Diabetes [facility])    Per New Patient Packet   . Fatty liver    Per New Patient Packet   . GERD (gastroesophageal reflux disease)   . Gout    Per New Patient Packet   . History of cardiac cath 2014   Dr.Berry, Per New Patient Packet   . History of esophagogastroduodenoscopy (EGD)    Per New Patient Packet   . History of tobacco use   . HLD (hyperlipidemia)   . HOH (hard of hearing)    Per New Patient Packet   . HTN (hypertension)   . Memory loss    Per New Patient Packet   . Numbness and tingling of both feet    Per New Patient Packet   . Obesity   . OSA (obstructive sleep apnea)  Per New Patient Packet, No CPAP  . Prostate cancer [facility])    Per New Patient Packet   . Rheumatoid arthritis [facility])    Per New Patient Packet   . Tremor    Per New Patient Packet   . Unsteady gait    Per New Patient Packet     Past Surgical History:  Procedure Laterality Date  . KNEE SURGERY Right    Per New Patient Packet   . LEFT HEART CATHETERIZATION WITH CORONARY ANGIOGRAM N/A 06/04/2013   Procedure: LEFT HEART CATHETERIZATION WITH CORONARY ANGIOGRAM;  Surgeon: Lorretta Harp, MD;  Location: [facility] CATH LAB;  Service: Cardiovascular;  Laterality: N/A;  . TRANSURETHRAL RESECTION OF PROSTATE  05/23/2018   Per New Patient Packet, [place]   . UPPER GASTROINTESTINAL ENDOSCOPY  2001   Per New Patient Packet   . VASECTOMY  1984   Per New Patient Packet     Allergies  Allergen Reactions  . Bee Venom Anaphylaxis    Outpatient Encounter Medications as of  12/02/2019  Medication Sig  . allopurinol (ZYLOPRIM) 300 MG tablet Take 300 mg by mouth daily.  Marland Kitchen atorvastatin (LIPITOR) 40 MG tablet Take 40 mg by mouth daily.  Marland Kitchen buPROPion (WELLBUTRIN XL) 300 MG 24 hr tablet Take 1 tablet (300 mg total) by mouth daily.  Marland Kitchen donepezil (ARICEPT) 5 MG tablet TAKE 1 TABLET NIGHTLY AT BEDTIME  . glipiZIDE (GLUCOTROL XL) 5 MG 24 hr tablet Take 5 mg by mouth daily with breakfast.  . glucose blood test strip One Touch Verio Test Strips. Use to test blood sugar twice daily or as directed. Dx: E11.40  . lisinopril (ZESTRIL) 20 MG tablet Take 1 tablet (20 mg total) by mouth daily.  Marland Kitchen LORazepam (ATIVAN) 0.5 MG tablet Take 0.5 tablets (0.25 mg total) by mouth 3 (three) times daily as needed for anxiety.  . Melatonin 5 MG TABS Take 1 tablet by mouth at bedtime.  . metFORMIN (GLUCOPHAGE) 1000 MG tablet Take 1,000 mg by mouth 2 (two) times daily with a meal.  . nitroGLYCERIN (NITROSTAT) 0.4 MG SL tablet Place 1 tablet (0.4 mg total) under the tongue every 5 (five) minutes as needed for chest pain.  . pantoprazole (PROTONIX) 40 MG tablet Take 40 mg by mouth daily.  . TRULICITY 1.5 0000000 SOPN Inject 1.5 mg into the skin every 7 (seven) days.  . Turmeric Curcumin 500 MG CAPS Take by mouth daily.  . vitamin B-12 (CYANOCOBALAMIN) 1000 MCG tablet Take 1,000 mcg by mouth daily.   . [DISCONTINUED] dexlansoprazole (DEXILANT) 60 MG capsule Take 1 capsule (60 mg total) by mouth daily as needed.  . [DISCONTINUED] traMADol-acetaminophen (ULTRACET) 37.5-325 MG tablet TAKE 1 TABLET 4 TIMES DAILY AS NEEDED FOR PAIN   No facility-administered encounter medications on file as of 12/02/2019.    Review of Systems:  Review of Systems  Constitutional: Negative for activity change, appetite change and fatigue.  HENT: Positive for hearing loss. Negative for dental problem and trouble swallowing.   Eyes: Negative for photophobia and visual disturbance.  Respiratory: Negative for cough,  shortness of breath and wheezing.   Cardiovascular: Positive for chest pain.  Gastrointestinal: Positive for abdominal pain, diarrhea and nausea.  Endocrine: Negative for polydipsia, polyphagia and polyuria.  Genitourinary: Positive for frequency. Negative for dysuria and hematuria.  Musculoskeletal: Positive for arthralgias and joint swelling. Negative for back pain.  Skin:       Boil on buttox lower back  Neurological: Negative for dizziness, tremors and headaches.  Psychiatric/Behavioral: Positive for agitation. Negative for dysphoric mood and sleep disturbance. The patient is not nervous/anxious.     Health Maintenance  Topic Date Due  . Hepatitis C Screening  11/12/1956  . HIV Screening  12/15/1971  . COLONOSCOPY  12/14/2006  . TETANUS/TDAP  09/18/2024  . INFLUENZA VACCINE  Completed    Physical Exam: Vitals:   12/02/19 1255  BP: 130/80  Pulse: 74  Temp: 97.7 F (36.5 C)  TempSrc: Temporal  SpO2: 97%  Weight: (!) 325 lb (147.4 kg)  Height: 5\' 9"  (1.753 m)   Body mass index is 47.99 kg/m. Physical Exam Vitals reviewed.  Constitutional:      Appearance: Normal appearance. He is normal weight.  HENT:     Head: Normocephalic.  Cardiovascular:     Rate and Rhythm: Normal rate and regular rhythm.     Pulses: Normal pulses.     Heart sounds: Normal heart sounds. No murmur.  Pulmonary:     Effort: Pulmonary effort is normal. No respiratory distress.     Breath sounds: Normal breath sounds. No wheezing.  Abdominal:     General: Bowel sounds are normal.     Tenderness: There is abdominal tenderness.    Musculoskeletal:     Right lower leg: No edema.     Left lower leg: No edema.  Feet:     Right foot:     Skin integrity: Skin integrity normal.     Toenail Condition: Right toenails are abnormally thick.     Left foot:     Skin integrity: Skin integrity normal.     Toenail Condition: Left toenails are abnormally thick.  Skin:    General: Skin is warm and dry.       Capillary Refill: Capillary refill takes less than 2 seconds.       Neurological:     Mental Status: He is alert.     Sensory: Sensation is intact.     Motor: Tremor present.  Psychiatric:        Attention and Perception: Attention normal.        Mood and Affect: Mood normal.        Speech: Speech normal.        Behavior: Behavior is cooperative.        Cognition and Memory: Memory is impaired.     Labs reviewed: Basic Metabolic Panel: Recent Labs    12/07/18 0000 05/24/19 1131 08/29/19 1415  NA 136* 138 139  K 4.2 4.4 3.9  CL  --  103 104  CO2  --  22 24  GLUCOSE  --  168* 224*  BUN 16 25 18   CREATININE 0.9 1.11 1.02  CALCIUM  --  9.2 9.6   Liver Function Tests: Recent Labs    12/07/18 0000 05/24/19 1131  AST 16 21  ALT 22 28  ALKPHOS 70  --   BILITOT  --  0.8  PROT  --  6.9   No results for input(s): LIPASE, AMYLASE in the last 8760 hours. No results for input(s): AMMONIA in the last 8760 hours. CBC: Recent Labs    05/24/19 1131  WBC 9.1  NEUTROABS 5,669  HGB 14.8  HCT 44.5  MCV 88.3  PLT 254   Lipid Panel: Recent Labs    05/24/19 1131  CHOL 111  HDL 45  LDLCALC 36  TRIG 240*  CHOLHDL 2.5   Lab Results  Component Value Date   HGBA1C 8.2 (H) 08/29/2019  Procedures since last visit: No results found.  Assessment/Plan 1. Right upper quadrant pain - suspect fatty liver disease as main cause of pain - US abdomen limited RUQ - wear loose clothing, avoid restrictive clothing - referral to GI specialist - hepatic panel- today  2. Type 2 diabetes mellitus with diabetic neuropathy, without long-term current use of insulin (HCC) - condition is poorly controlled - memory issues make it hard for family to follow a healthy diet for him - he continues to make poor food choices and follows sedentary lifestyle - recommend diabetic diet - limit foods high in carbs and refined sugar - recommend calorie counting in an effort to lose  weight - hemoglobin A1C- today - basic metabolic panel- today 3. Lewy body dementia with behavioral disturbance (HCC) - stable at this time, judgement appropriate today - reports vivid dreams - continue donezepril 5 mg daily  4. Obesity, morbid (more than 100 lbs over ideal weight or BMI > 40) (HCC) - weight has not changed since last visit - recommend strict diet with limited calorie intake  - recommend increasing physical activity to promote weight loss  5. Body mass index (BMI) of 45.0-49.9 in adult [facility]) - same as above  6. Gastroesophageal reflux disease with esophagitis without hemorrhage - suspect poor diet contributing to reflux issues  - can discontinue protonix and continue with dexilant - referral to GI specialist - stop taking Goody Powder  7. Barrett's esophagus with dysplasia - same as above  8. Inflamed sebaceous cyst - sebaceous cyst present on left buttox, center is pustule - unable to see dermatologist - please schedule follow up for biopsy in office - start doxycycline 100 mg BID for 10 days - complete blood panel with differential/platelets- today  9. Prostate cancer [facility]) - followed by oncology and urology - PSA- today    Labs/tests ordered:  Complete blood count with differential/platelet, basic metabolic panel, hemoglobin A1C, hepatic function panel, PSA- today Next appt: follow up for cyst removal

## 2019-12-03 ENCOUNTER — Encounter: Payer: Self-pay | Admitting: *Deleted

## 2019-12-03 LAB — CBC WITH DIFFERENTIAL/PLATELET
Absolute Monocytes: 638 cells/uL (ref 200–950)
Basophils Absolute: 38 cells/uL (ref 0–200)
Basophils Relative: 0.5 %
Eosinophils Absolute: 251 cells/uL (ref 15–500)
Eosinophils Relative: 3.3 %
HCT: 42 % (ref 38.5–50.0)
Hemoglobin: 14.1 g/dL (ref 13.2–17.1)
Lymphs Abs: 2196 cells/uL (ref 850–3900)
MCH: 28.9 pg (ref 27.0–33.0)
MCHC: 33.6 g/dL (ref 32.0–36.0)
MCV: 86.1 fL (ref 80.0–100.0)
MPV: 10.7 fL (ref 7.5–12.5)
Monocytes Relative: 8.4 %
Neutro Abs: 4476 cells/uL (ref 1500–7800)
Neutrophils Relative %: 58.9 %
Platelets: 220 10*3/uL (ref 140–400)
RBC: 4.88 10*6/uL (ref 4.20–5.80)
RDW: 12.3 % (ref 11.0–15.0)
Total Lymphocyte: 28.9 %
WBC: 7.6 10*3/uL (ref 3.8–10.8)

## 2019-12-03 LAB — HEMOGLOBIN A1C
Hgb A1c MFr Bld: 8.5 % of total Hgb — ABNORMAL HIGH (ref <5.7)
Mean Plasma Glucose: 197 (calc)
eAG (mmol/L): 10.9 (calc)

## 2019-12-03 LAB — HEPATIC FUNCTION PANEL
AG Ratio: 1.5 (calc) (ref 1.0–2.5)
ALT: 19 U/L (ref 9–46)
AST: 14 U/L (ref 10–35)
Albumin: 4.1 g/dL (ref 3.6–5.1)
Alkaline phosphatase (APISO): 62 U/L (ref 35–144)
Bilirubin, Direct: 0.2 mg/dL (ref 0.0–0.2)
Globulin: 2.7 g/dL (calc) (ref 1.9–3.7)
Indirect Bilirubin: 0.6 mg/dL (calc) (ref 0.2–1.2)
Total Bilirubin: 0.8 mg/dL (ref 0.2–1.2)
Total Protein: 6.8 g/dL (ref 6.1–8.1)

## 2019-12-03 LAB — BASIC METABOLIC PANEL
BUN: 17 mg/dL (ref 7–25)
CO2: 26 mmol/L (ref 20–32)
Calcium: 9.6 mg/dL (ref 8.6–10.3)
Chloride: 101 mmol/L (ref 98–110)
Creat: 0.9 mg/dL (ref 0.70–1.25)
Glucose, Bld: 124 mg/dL — ABNORMAL HIGH (ref 65–99)
Potassium: 4.2 mmol/L (ref 3.5–5.3)
Sodium: 138 mmol/L (ref 135–146)

## 2019-12-03 LAB — PSA: PSA: 0.5 ng/mL (ref <=4.0)

## 2019-12-03 NOTE — Progress Notes (Signed)
PSA only 0.5 Sugar average 8.5 up from 8.2 like his weight and consistent with his very poor eating habits.  This puts him at risk for strokes and heart attacks and sudden death. Blood counts are normal Electrolytes and kidneys are ok Liver panel is ok--no signs of obstructing stone or sludge, but that is something that can change day to day

## 2019-12-18 ENCOUNTER — Other Ambulatory Visit: Payer: Self-pay | Admitting: Internal Medicine

## 2019-12-19 ENCOUNTER — Ambulatory Visit
Admission: RE | Admit: 2019-12-19 | Discharge: 2019-12-19 | Disposition: A | Discharge status: Home / Self Care | Payer: BC Managed Care – PPO | Source: Ambulatory Visit | Attending: Internal Medicine | Admitting: Internal Medicine

## 2019-12-19 DIAGNOSIS — E1140 Type 2 diabetes mellitus with diabetic neuropathy, unspecified: Secondary | ICD-10-CM

## 2019-12-19 DIAGNOSIS — K8020 Calculus of gallbladder without cholecystitis without obstruction: Secondary | ICD-10-CM | POA: Diagnosis not present

## 2019-12-19 DIAGNOSIS — R1011 Right upper quadrant pain: Secondary | ICD-10-CM

## 2019-12-19 DIAGNOSIS — K7689 Other specified diseases of liver: Secondary | ICD-10-CM | POA: Diagnosis not present

## 2019-12-23 ENCOUNTER — Other Ambulatory Visit: Payer: Self-pay | Admitting: Internal Medicine

## 2019-12-23 DIAGNOSIS — L723 Sebaceous cyst: Secondary | ICD-10-CM

## 2019-12-23 NOTE — Progress Notes (Signed)
He does still have gallstones but not inflammation or blocked ducts.   He has fatty liver disease.

## 2019-12-23 NOTE — Telephone Encounter (Signed)
Pt was only supposed to be on medication for 10 days, refill denied

## 2019-12-28 ENCOUNTER — Other Ambulatory Visit: Payer: Self-pay | Admitting: Internal Medicine

## 2019-12-30 ENCOUNTER — Other Ambulatory Visit: Payer: Self-pay | Admitting: *Deleted

## 2019-12-30 MED ORDER — PANTOPRAZOLE SODIUM 40 MG PO TBEC: 40.0000 mg | DELAYED_RELEASE_TABLET | Freq: Every day | ORAL | 1 refills | Status: DC

## 2019-12-30 MED ORDER — GLIPIZIDE ER 5 MG PO TB24: 5.0000 mg | ORAL_TABLET | Freq: Every day | ORAL | 1 refills | Status: DC

## 2020-01-06 ENCOUNTER — Ambulatory Visit: Payer: BC Managed Care – PPO | Admitting: Internal Medicine

## 2020-01-20 DIAGNOSIS — C61 Malignant neoplasm of prostate: Secondary | ICD-10-CM | POA: Diagnosis not present

## 2020-01-21 ENCOUNTER — Other Ambulatory Visit: Payer: Self-pay | Admitting: Internal Medicine

## 2020-01-21 DIAGNOSIS — F02818 Dementia in other diseases classified elsewhere, unspecified severity, with other behavioral disturbance: Secondary | ICD-10-CM

## 2020-01-21 DIAGNOSIS — F0281 Dementia in other diseases classified elsewhere with behavioral disturbance: Secondary | ICD-10-CM

## 2020-03-10 ENCOUNTER — Other Ambulatory Visit: Payer: Self-pay | Admitting: Internal Medicine

## 2020-03-13 ENCOUNTER — Ambulatory Visit: Payer: BC Managed Care – PPO | Attending: Internal Medicine

## 2020-03-13 DIAGNOSIS — Z23 Encounter for immunization: Secondary | ICD-10-CM

## 2020-04-06 DIAGNOSIS — N401 Enlarged prostate with lower urinary tract symptoms: Secondary | ICD-10-CM | POA: Diagnosis not present

## 2020-04-06 DIAGNOSIS — C61 Malignant neoplasm of prostate: Secondary | ICD-10-CM | POA: Diagnosis not present

## 2020-04-06 DIAGNOSIS — N138 Other obstructive and reflux uropathy: Secondary | ICD-10-CM | POA: Diagnosis not present

## 2020-04-07 ENCOUNTER — Ambulatory Visit: Payer: BC Managed Care – PPO | Attending: Internal Medicine

## 2020-04-07 DIAGNOSIS — Z23 Encounter for immunization: Secondary | ICD-10-CM

## 2020-04-07 NOTE — Progress Notes (Signed)
   Covid-19 Vaccination Clinic  Name:  DAJEAN GOCHEZ    MRN: [id number] DOB: 1956-05-25  04/07/2020  Mr. Delee was observed post Covid-19 immunization for 30 minutes based on pre-vaccination screening without incident. He was provided with Vaccine Information Sheet and instruction to access the V-Safe system.   Mr. Kehres was instructed to call 911 with any severe reactions post vaccine: Marland Kitchen Difficulty breathing  . Swelling of face and throat  . A fast heartbeat  . A bad rash all over body  . Dizziness and weakness   Immunizations Administered    Name Date Dose VIS Date Route   Pfizer COVID-19 Vaccine 04/07/2020  3:00 PM 0.3 mL 02/12/2019 Intramuscular   Manufacturer: [place]   Lot: [id number]   [place]: [id number]

## 2020-04-11 ENCOUNTER — Other Ambulatory Visit: Payer: Self-pay | Admitting: Internal Medicine

## 2020-04-13 ENCOUNTER — Encounter: Payer: Self-pay | Admitting: Internal Medicine

## 2020-04-13 ENCOUNTER — Other Ambulatory Visit: Payer: Self-pay

## 2020-04-13 ENCOUNTER — Ambulatory Visit (INDEPENDENT_AMBULATORY_CARE_PROVIDER_SITE_OTHER): Payer: BC Managed Care – PPO | Admitting: Internal Medicine

## 2020-04-13 VITALS — BP 128/72 | HR 78 | Temp 98.7°F | Ht 69.0 in | Wt 327.6 lb

## 2020-04-13 DIAGNOSIS — Z1159 Encounter for screening for other viral diseases: Secondary | ICD-10-CM | POA: Diagnosis not present

## 2020-04-13 DIAGNOSIS — K22719 Barrett's esophagus with dysplasia, unspecified: Secondary | ICD-10-CM | POA: Diagnosis not present

## 2020-04-13 DIAGNOSIS — C61 Malignant neoplasm of prostate: Secondary | ICD-10-CM

## 2020-04-13 DIAGNOSIS — Z1211 Encounter for screening for malignant neoplasm of colon: Secondary | ICD-10-CM

## 2020-04-13 DIAGNOSIS — F0281 Dementia in other diseases classified elsewhere with behavioral disturbance: Secondary | ICD-10-CM

## 2020-04-13 DIAGNOSIS — E66813 Obesity, class 3: Secondary | ICD-10-CM

## 2020-04-13 DIAGNOSIS — E1140 Type 2 diabetes mellitus with diabetic neuropathy, unspecified: Secondary | ICD-10-CM | POA: Diagnosis not present

## 2020-04-13 DIAGNOSIS — R195 Other fecal abnormalities: Secondary | ICD-10-CM

## 2020-04-13 DIAGNOSIS — G3183 Dementia with Lewy bodies: Secondary | ICD-10-CM | POA: Diagnosis not present

## 2020-04-13 DIAGNOSIS — Z6841 Body Mass Index (BMI) 40.0 and over, adult: Secondary | ICD-10-CM

## 2020-04-13 MED ORDER — ATORVASTATIN CALCIUM 40 MG PO TABS: 40.0000 mg | ORAL_TABLET | Freq: Every day | ORAL | 1 refills | Status: DC

## 2020-04-13 NOTE — Progress Notes (Signed)
Location:  [facility] clinic Provider:  Marina Desire L. Mariea Clonts, D.O., C.M.D.  Goals of Care:  Advanced Directives 04/13/2020  Does Patient Have a Medical Advance Directive? No  Does patient want to make changes to medical advance directive? -  Would patient like information on creating a medical advance directive? No - Patient declined  Pre-existing out of facility DNR order (yellow form or pink MOST form) -     Chief Complaint  Patient presents with  . Medical Management of Chronic Issues    HPI: Patient is a 64 y.o. male seen today for medical management of chronic diseases.    He says he's doing pretty good.  He's above ground.  Thinks he has some sinus congestion causing his lymph nodes to be swollen.  He's got some allergies.  Having diarrhea.  everytime time he eats, he's having diarrhea on the commode.  This is ongoing situation for 8-9 mos. Wonders about metformin.  It's become annoying if he eats out.  May occur 2-3 times a day.  Has one bm within 30 mins in the morning.  Had three boiled eggs,ouple slices of [place] and V8 juice and in 30 mins on the commode.  Sometimes it's a feeling like he'll have to go.  Has some hemorrhoids.  External hemorrhoids develop then when he makes effort.    He has neuropathy worse in his right foot.  He's staggering and uses his walking stick.  Thinks some is his inner ear.    He does have gallstones and fatty liver.    He is going to have a prostate biopsy 5/5.  They switched urologists to Dr. Felipa Eth with [facility] urology.  PSA was done.  He is getting up more to urinate.  Sometimes will wet the bed.  Avoids drinking fluids before bed.    Memory improved since around February.  He drove himself here.  He gets nervous and anxious before driving.  Avoids driving at night.  He double paid a couple of bills.  No episodes getting lost since Dec.  He has purpose to live for his grandchildren--Hannah's sister has breast cancer.  He's taking more anxiety  pills.  Still has his same sleep pattern of staying up late to watch cowboy movies.     He's doing a lot better with taking his medications.  He had gone up to 340 lbs b/w visits.  He's a lot more--walks in cemeteries and reads headstones.    He's wearing new shoes that Jarrett Soho got him.    Tremor has gotten a little worse.  It's a problem when eating.  He could not put the batteries into his hearing aids.    Continues to have GERD if he eats the wrong things.    Past Medical History:  Diagnosis Date  . B12 deficiency    Per New Patient Packet   . Barrett esophagus    Per New Patient Packet   . Depression    Per New Patient Packet   . Diabetes [facility])    Per New Patient Packet   . Fatty liver    Per New Patient Packet   . GERD (gastroesophageal reflux disease)   . Gout    Per New Patient Packet   . History of cardiac cath 2014   Dr.Berry, Per New Patient Packet   . History of esophagogastroduodenoscopy (EGD)    Per New Patient Packet   . History of tobacco use   . HLD (hyperlipidemia)   . HOH (  hard of hearing)    Per New Patient Packet   . HTN (hypertension)   . Memory loss    Per New Patient Packet   . Numbness and tingling of both feet    Per New Patient Packet   . Obesity   . OSA (obstructive sleep apnea)    Per New Patient Packet, No CPAP  . Prostate cancer [facility])    Per New Patient Packet   . Rheumatoid arthritis [facility])    Per New Patient Packet   . Tremor    Per New Patient Packet   . Unsteady gait    Per New Patient Packet     Past Surgical History:  Procedure Laterality Date  . KNEE SURGERY Right    Per New Patient Packet   . LEFT HEART CATHETERIZATION WITH CORONARY ANGIOGRAM N/A 06/04/2013   Procedure: LEFT HEART CATHETERIZATION WITH CORONARY ANGIOGRAM;  Surgeon: Lorretta Harp, MD;  Location: [facility] CATH LAB;  Service: Cardiovascular;  Laterality: N/A;  . TRANSURETHRAL RESECTION OF PROSTATE  05/23/2018   Per New Patient Packet, [place]   . UPPER  GASTROINTESTINAL ENDOSCOPY  2001   Per New Patient Packet   . VASECTOMY  1984   Per New Patient Packet     Allergies  Allergen Reactions  . Bee Venom Anaphylaxis    Outpatient Encounter Medications as of 04/13/2020  Medication Sig  . allopurinol (ZYLOPRIM) 300 MG tablet Take 300 mg by mouth daily.  Marland Kitchen atorvastatin (LIPITOR) 40 MG tablet Take 1 tablet (40 mg total) by mouth daily.  Marland Kitchen buPROPion (WELLBUTRIN XL) 300 MG 24 hr tablet Take 1 tablet (300 mg total) by mouth daily.  . Cholecalciferol (VITAMIN D3) 1.25 MG (50000 UT) CAPS Take by mouth.  . dexlansoprazole (DEXILANT) 60 MG capsule Take 1 capsule (60 mg total) by mouth daily as needed.  . donepezil (ARICEPT) 5 MG tablet TAKE 1 TABLET NIGHTLY AT BEDTIME  . doxycycline (VIBRA-TABS) 100 MG tablet Take 1 tablet (100 mg total) by mouth 2 (two) times daily.  Marland Kitchen ELDERBERRY PO Take by mouth.  Marland Kitchen glipiZIDE (GLUCOTROL XL) 5 MG 24 hr tablet Take 1 tablet (5 mg total) by mouth daily with breakfast.  . glucose blood test strip One Touch Verio Test Strips. Use to test blood sugar twice daily or as directed. Dx: E11.40  . lisinopril (ZESTRIL) 20 MG tablet TAKE 1 TABLET BY MOUTH DAILY  . LORazepam (ATIVAN) 0.5 MG tablet Take 0.5 tablets (0.25 mg total) by mouth 3 (three) times daily as needed for anxiety.  . Melatonin 5 MG TABS Take 1 tablet by mouth at bedtime.  . metFORMIN (GLUCOPHAGE) 1000 MG tablet TAKE 1 TABLET BY MOUTH TWICE DAILY WITH MEALS  . nitroGLYCERIN (NITROSTAT) 0.4 MG SL tablet Place 1 tablet (0.4 mg total) under the tongue every 5 (five) minutes as needed for chest pain.  . pantoprazole (PROTONIX) 40 MG tablet Take 1 tablet (40 mg total) by mouth daily.  . TRULICITY 1.5 0000000 SOPN Inject 1.5 mg into the skin every 7 days.  . Turmeric Curcumin 500 MG CAPS Take by mouth daily.  . vitamin B-12 (CYANOCOBALAMIN) 1000 MCG tablet Take 1,000 mcg by mouth daily.   Marland Kitchen zinc gluconate 50 MG tablet Take 50 mg by mouth daily.  . [DISCONTINUED]  atorvastatin (LIPITOR) 40 MG tablet Take 40 mg by mouth daily.   No facility-administered encounter medications on file as of 04/13/2020.    Review of Systems:  Review of Systems  Constitutional: Negative  for chills, fever and malaise/fatigue.  HENT: Positive for hearing loss. Negative for congestion and sore throat.        Hearing aids  Eyes: Negative for blurred vision.  Respiratory: Negative for cough and shortness of breath.   Cardiovascular: Negative for chest pain, palpitations and leg swelling.  Gastrointestinal: Positive for abdominal pain, diarrhea and heartburn. Negative for blood in stool, constipation, melena, nausea and vomiting.       Hemorrhoids  Genitourinary: Negative for dysuria.  Musculoskeletal: Positive for falls and joint pain.  Skin: Negative for itching and rash.       Small nevus on left cheek, cyst on right forearm  Neurological: Positive for tingling and sensory change. Negative for loss of consciousness.  Endo/Heme/Allergies: Does not bruise/bleed easily.  Psychiatric/Behavioral: Positive for memory loss. Negative for depression. The patient is nervous/anxious. The patient does not have insomnia.        Sleep patterns remain abnormal, but he's getting adequate rest    Health Maintenance  Topic Date Due  . Hepatitis C Screening  Never done  . FOOT EXAM  Never done  . COLONOSCOPY  Never done  . HEMOGLOBIN A1C  06/01/2020  . INFLUENZA VACCINE  07/19/2020  . OPHTHALMOLOGY EXAM  12/19/2020  . TETANUS/TDAP  09/18/2024  . PNEUMOCOCCAL POLYSACCHARIDE VACCINE AGE 27-64 HIGH RISK  Completed  . COVID-19 Vaccine  Completed  . HIV Screening  Completed    Physical Exam: Vitals:   04/13/20 1307  BP: 128/72  Pulse: 78  Temp: 98.7 F (37.1 C)  TempSrc: Temporal  SpO2: 98%  Weight: (!) 327 lb 9.6 oz (148.6 kg)  Height: 5\' 9"  (1.753 m)   Body mass index is 48.38 kg/m. Physical Exam Constitutional:      Appearance: Normal appearance.     Comments:  Morbidly obese male  HENT:     Ears:     Comments: New hearing aids--he was able to hear me in a normal tone of voice Eyes:     Comments: New glasses  Cardiovascular:     Rate and Rhythm: Normal rate and regular rhythm.     Pulses: Normal pulses.     Heart sounds: Normal heart sounds.  Pulmonary:     Effort: Pulmonary effort is normal.     Breath sounds: Normal breath sounds. No rales.  Abdominal:     General: Bowel sounds are normal. There is no distension.     Palpations: Abdomen is soft. There is no mass.     Tenderness: There is no abdominal tenderness. There is no guarding or rebound.  Musculoskeletal:        General: Normal range of motion.     Right lower leg: No edema.     Left lower leg: No edema.  Skin:    General: Skin is warm and dry.     Comments: See ros  Neurological:     General: No focal deficit present.     Mental Status: He is alert and oriented to person, place, and time.     Sensory: Sensory deficit present.     Gait: Gait abnormal.  Psychiatric:        Mood and Affect: Mood normal.        Behavior: Behavior normal.     Comments: Pleasant and sociable today--voluntarily speaking to me and sharing concerns     Labs reviewed: Basic Metabolic Panel: Recent Labs    05/24/19 1131 08/29/19 1415 12/02/19 1426  NA 138 139 138  K 4.4 3.9 4.2  CL 103 104 101  CO2 22 24 26   GLUCOSE 168* 224* 124*  BUN 25 18 17   CREATININE 1.11 1.02 0.90  CALCIUM 9.2 9.6 9.6   Liver Function Tests: Recent Labs    05/24/19 1131 12/02/19 1426  AST 21 14  ALT 28 19  BILITOT 0.8 0.8  PROT 6.9 6.8   No results for input(s): LIPASE, AMYLASE in the last 8760 hours. No results for input(s): AMMONIA in the last 8760 hours. CBC: Recent Labs    05/24/19 1131 12/02/19 1426  WBC 9.1 7.6  NEUTROABS 5,669 4,476  HGB 14.8 14.1  HCT 44.5 42.0  MCV 88.3 86.1  PLT 254 220   Lipid Panel: Recent Labs    05/24/19 1131  CHOL 111  HDL 45  LDLCALC 36  TRIG 240*    CHOLHDL 2.5   Lab Results  Component Value Date   HGBA1C 8.5 (H) 12/02/2019    Assessment/Plan 1. Type 2 diabetes mellitus with diabetic neuropathy, without long-term current use of insulin ([place]) - need to f/u labs as he has not been eating a healthy diet lately and had been really inactive until the past few weeks when weather improved -if hba1c worse, will need to increase trulicity dose - CBC with Differential/Platelet - Hemoglobin A1c - COMPLETE METABOLIC PANEL WITH GFR  2. Lewy body dementia with behavioral disturbance ([place]) -doing a lot better lately -seems ativan is actually helping him  3. Barrett's esophagus with dysplasia - GERD bothersome due to poor food choices and eating late at night  - Ambulatory referral to Gastroenterology  4. Colon cancer screening - has been to Dr. Ferdinand Lango at [facility] historically due to a program they used to have where family got their testing free except anesthesia, but due for f/u cscope and also has been having the loose bms after meals which I suspect is pancreatic insufficiency b/c he's eating plenty so it should not be due to inadequate po intake with metformin -could also be due to gallstones - Ambulatory referral to Gastroenterology ([facility])  5. Prostate cancer ([place]) -PSA was done with new urologist, Dr. Felipa Eth but I cannot see results -is for prostate biopsy coming up and await results  6. 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 Body mass index (BMI) of 45.0-49.9 in adult [facility]) -had been getting better, but then returned to old eating habits and lack of activity -f/u labs   8. Class 3 severe obesity due to excess calories with serious comorbidity and body mass index (BMI) of 45.0 to 49.9 in adult [facility]) -encouraged walking and improved diet--he's doing a little better with things to be a role model for his  grandchildren  9. Encounter for hepatitis C screening test for low risk patient - Hepatitis C antibody  Labs/tests ordered:   Lab Orders     CBC with Differential/Platelet     Hemoglobin A1c     COMPLETE METABOLIC PANEL WITH GFR     Hepatitis C antibody  Next appt: 08/17/2020 med mgt  Steve Gregg L. Tayelor Osborne, D.O. [facility] [address], [place] [address] Cell Phone (Mon-Fri 8am-5pm):  [phone] On Call:  [phone] & follow prompts after 5pm & weekends Office Phone:  [phone] Office Fax:  [phone]

## 2020-04-14 ENCOUNTER — Other Ambulatory Visit: Payer: Self-pay | Admitting: Internal Medicine

## 2020-04-14 DIAGNOSIS — E1140 Type 2 diabetes mellitus with diabetic neuropathy, unspecified: Secondary | ICD-10-CM

## 2020-04-14 LAB — CBC WITH DIFFERENTIAL/PLATELET
Absolute Monocytes: 679 cells/uL (ref 200–950)
Basophils Absolute: 47 cells/uL (ref 0–200)
Basophils Relative: 0.6 %
Eosinophils Absolute: 187 cells/uL (ref 15–500)
Eosinophils Relative: 2.4 %
HCT: 43 % (ref 38.5–50.0)
Hemoglobin: 14.5 g/dL (ref 13.2–17.1)
Lymphs Abs: 2402 cells/uL (ref 850–3900)
MCH: 29.6 pg (ref 27.0–33.0)
MCHC: 33.7 g/dL (ref 32.0–36.0)
MCV: 87.8 fL (ref 80.0–100.0)
MPV: 10.7 fL (ref 7.5–12.5)
Monocytes Relative: 8.7 %
Neutro Abs: 4485 cells/uL (ref 1500–7800)
Neutrophils Relative %: 57.5 %
Platelets: 230 10*3/uL (ref 140–400)
RBC: 4.9 10*6/uL (ref 4.20–5.80)
RDW: 12.7 % (ref 11.0–15.0)
Total Lymphocyte: 30.8 %
WBC: 7.8 10*3/uL (ref 3.8–10.8)

## 2020-04-14 LAB — COMPLETE METABOLIC PANEL WITH GFR
AG Ratio: 1.7 (calc) (ref 1.0–2.5)
ALT: 23 U/L (ref 9–46)
AST: 15 U/L (ref 10–35)
Albumin: 4 g/dL (ref 3.6–5.1)
Alkaline phosphatase (APISO): 55 U/L (ref 35–144)
BUN: 17 mg/dL (ref 7–25)
CO2: 25 mmol/L (ref 20–32)
Calcium: 9.4 mg/dL (ref 8.6–10.3)
Chloride: 104 mmol/L (ref 98–110)
Creat: 0.94 mg/dL (ref 0.70–1.25)
GFR, Est African American: 100 mL/min/{1.73_m2} (ref >=60)
GFR, Est Non African American: 86 mL/min/{1.73_m2} (ref >=60)
Globulin: 2.4 g/dL (calc) (ref 1.9–3.7)
Glucose, Bld: 133 mg/dL — ABNORMAL HIGH (ref 65–99)
Potassium: 4.4 mmol/L (ref 3.5–5.3)
Sodium: 140 mmol/L (ref 135–146)
Total Bilirubin: 0.4 mg/dL (ref 0.2–1.2)
Total Protein: 6.4 g/dL (ref 6.1–8.1)

## 2020-04-14 LAB — HEMOGLOBIN A1C
Hgb A1c MFr Bld: 7.8 % of total Hgb — ABNORMAL HIGH (ref <5.7)
Mean Plasma Glucose: 177 (calc)
eAG (mmol/L): 9.8 (calc)

## 2020-04-14 LAB — HEPATITIS C ANTIBODY
Hepatitis C Ab: NONREACTIVE
SIGNAL TO CUT-OFF: 0.01 (ref <1.00)

## 2020-04-14 IMAGING — US US ABDOMEN LIMITED
1 series · 14 of 25 positions shown · non-contrast
Comparison: None.

CLINICAL DATA: Upper abdominal pain, primarily right-sided

EXAM:
ULTRASOUND ABDOMEN LIMITED RIGHT UPPER QUADRANT

[Series 1: us abdomen limited · 0.22mm/px · 14 of 38 slices shown]
[im 1/38]
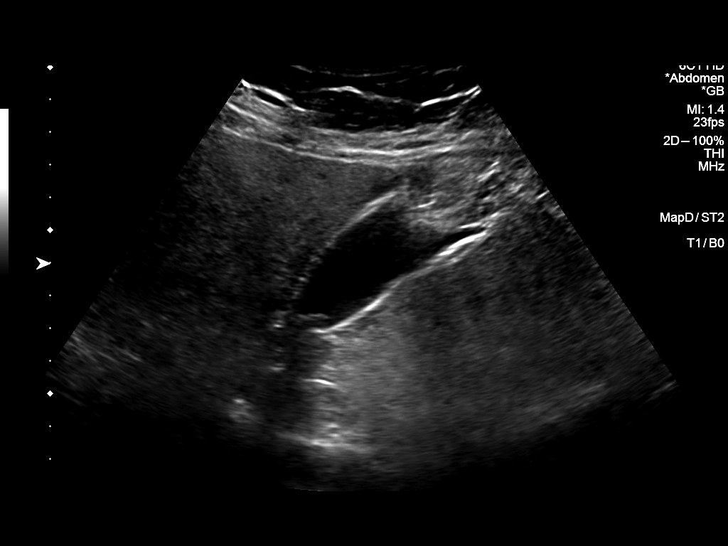
[im 4/38]
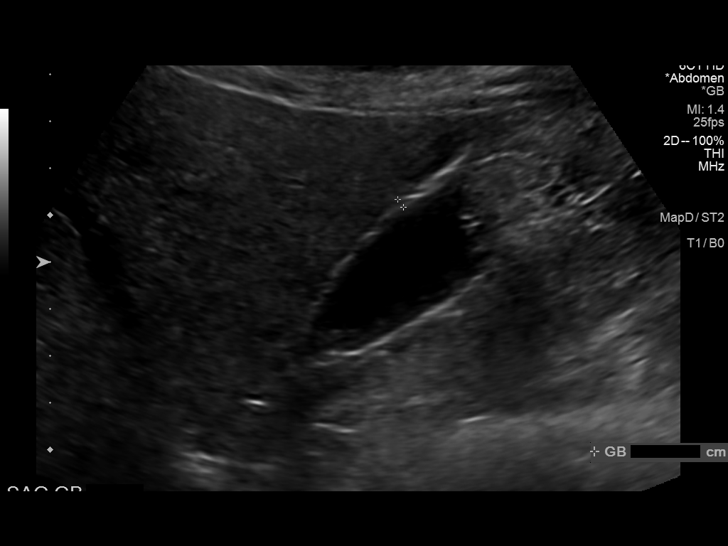
[im 7/38]
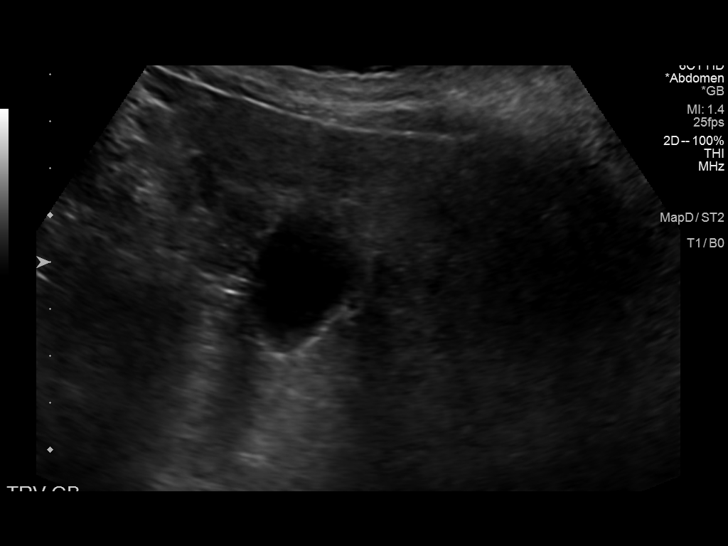
[im 10/38]
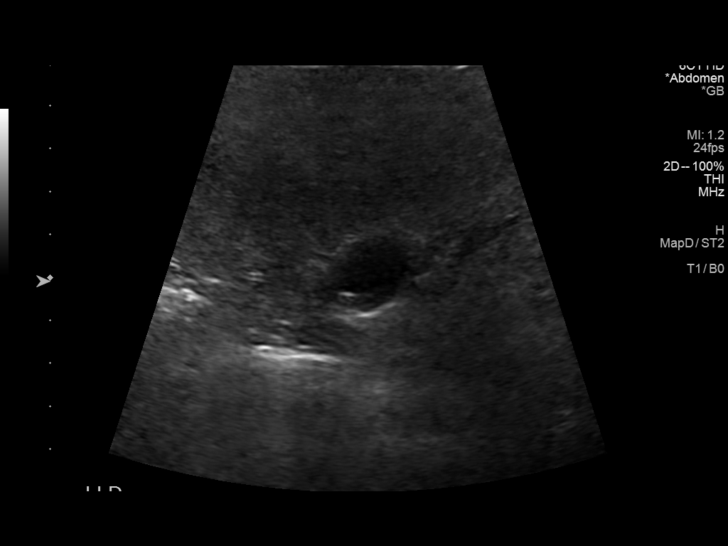
[im 13/38]
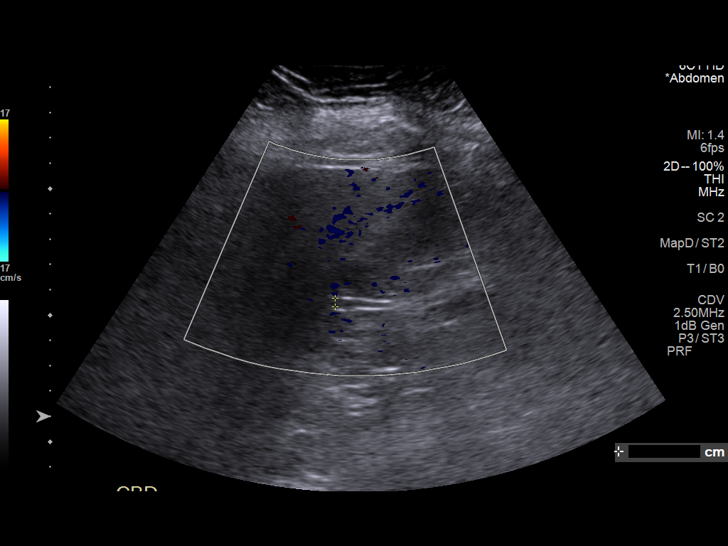
[im 14/38]
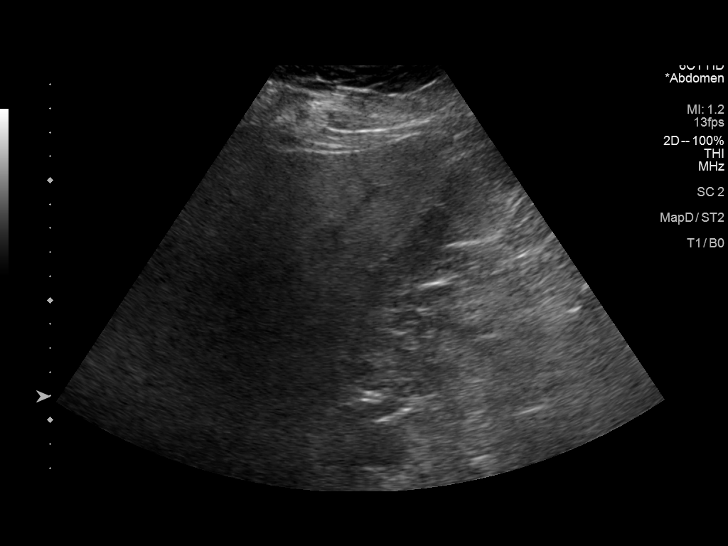
[im 17/38]
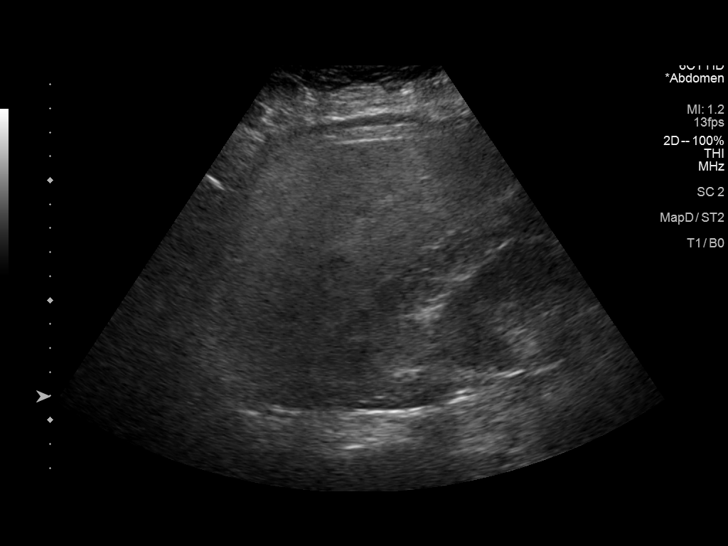
[im 21/38]
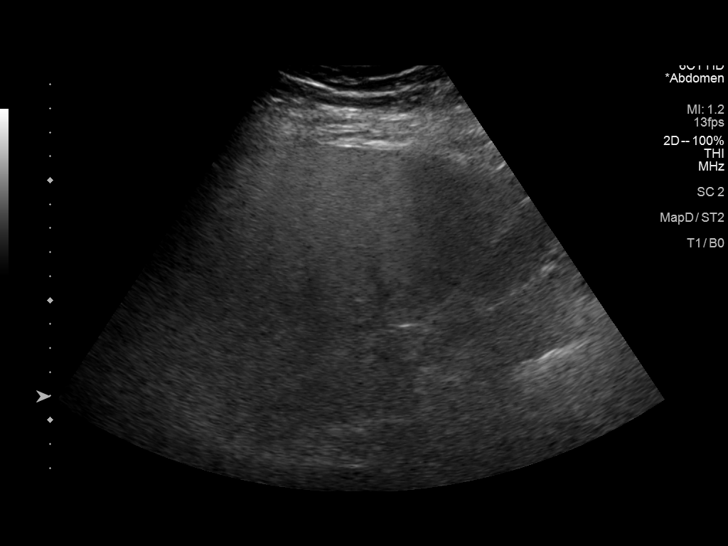
[im 24/38]
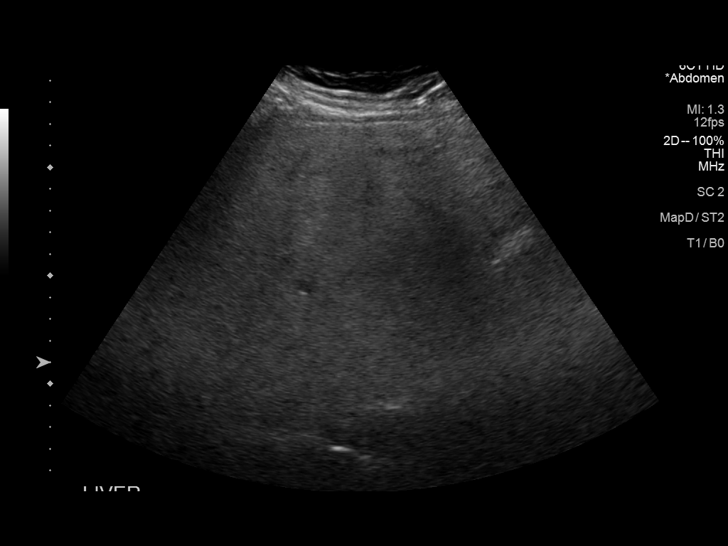
[im 25/38]
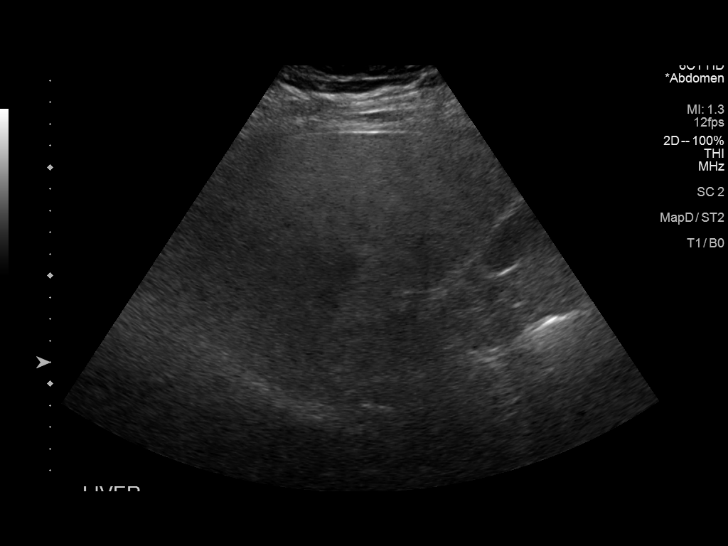
[im 28/38]
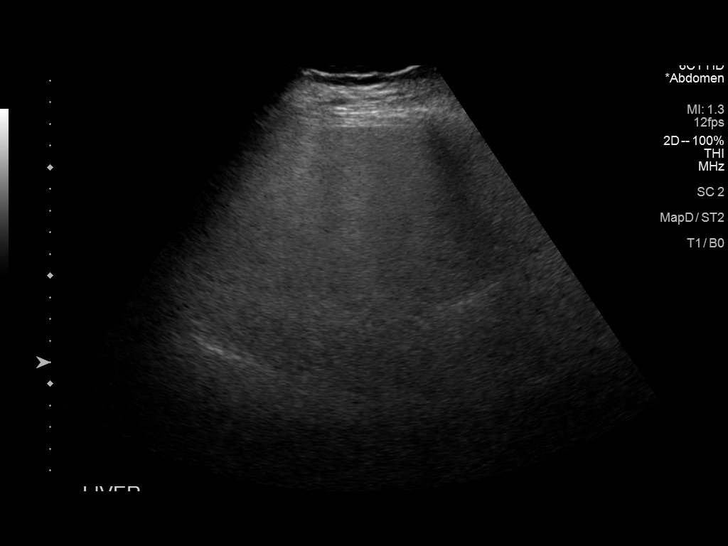
[im 31/38]
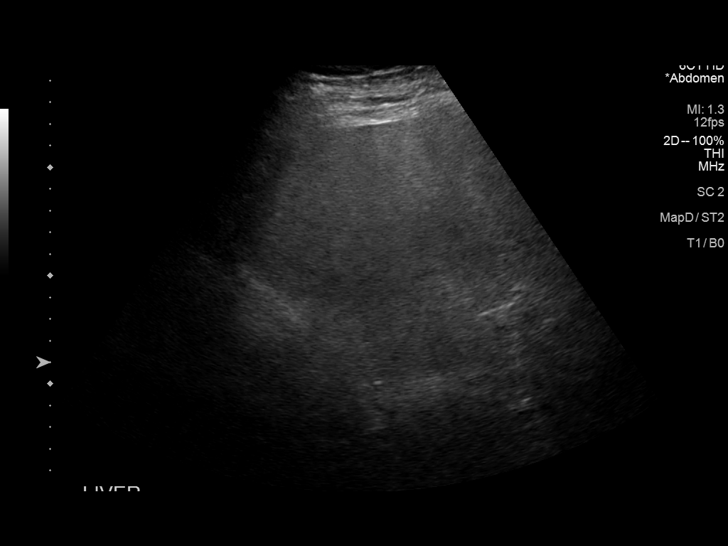
[im 34/38]
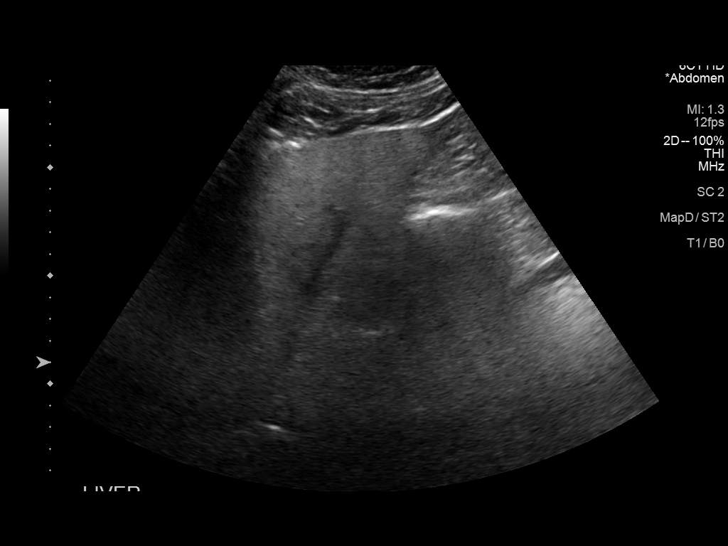
[im 38/38]
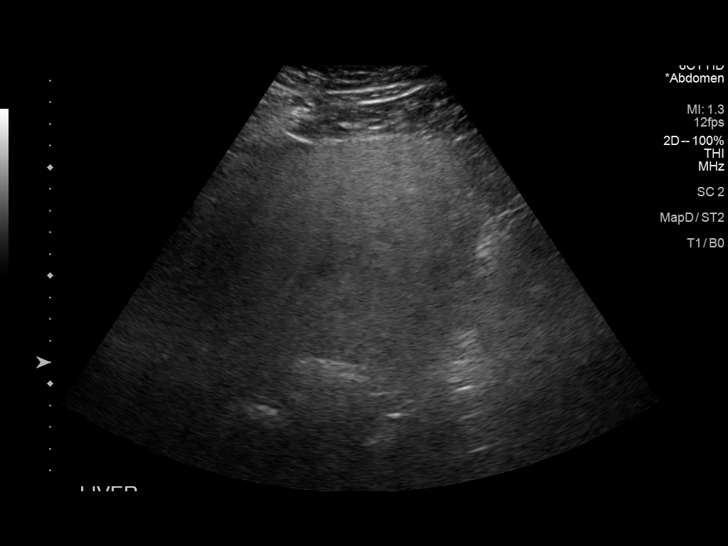

[14 of 25 positions shown; findings below may reference images not displayed]

FINDINGS: Gallbladder:

Within the gallbladder, there are echogenic foci which move and
shadow consistent with cholelithiasis. Largest gallstone measures 6
mm in length. There is no gallbladder wall thickening or
pericholecystic fluid. No sonographic Murphy sign noted by
sonographer.

Common bile duct:

Diameter: 3 mm. No intrahepatic or extrahepatic biliary duct
dilatation.

Liver:

No focal lesion identified. Liver echogenicity is increased
diffusely. Portal vein is patent on color Doppler imaging with
normal direction of blood flow towards the liver.

Other: None.
IMPRESSION: 1. Cholelithiasis. No gallbladder wall thickening or pericholecystic
fluid.

2. Diffuse increase in liver echogenicity, a finding indicative of
hepatic steatosis. While no focal liver lesions are evident on this
study, it must be cautioned that the sensitivity of ultrasound for
detection of focal liver lesions is diminished significantly in this
circumstance.

## 2020-04-14 MED ORDER — TRULICITY 3 MG/0.5ML ~~LOC~~ SOAJ: 3.0000 mg | SUBCUTANEOUS | 3 refills | Status: DC

## 2020-04-14 NOTE — Progress Notes (Signed)
Blood counts are normal. Sugar average is improved from 8.5 to 7.8 which is good.  Considering his young age and goals to be around for his grandchildren, I'd like to bump up his trulicity dose to 3mg  weekly (sent to [facility] pharmacy in [place]).  We may go up to max of 4.5mg  weekly eventually. Liver, kidneys and electrolytes were all ok and hepatitis C screen was negative. Continue to work on improving diet with less unhealthy meats, starchy fried items and sweets and more healthy veggies.  Also increase walking with the better weather.  Be sure to drink adequate water as it gets warmer outside.

## 2020-04-20 ENCOUNTER — Telehealth: Payer: Self-pay

## 2020-04-20 NOTE — Telephone Encounter (Signed)
Patient PA Approved for Trulicity.   Brandon [name]  Key: [place] #: [id number] Need help? Call us at [phone] Outcome: Approvedtoday Effective from 04/20/2020 through 04/19/2021.

## 2020-04-22 DIAGNOSIS — R972 Elevated prostate specific antigen [PSA]: Secondary | ICD-10-CM | POA: Diagnosis not present

## 2020-04-22 DIAGNOSIS — N401 Enlarged prostate with lower urinary tract symptoms: Secondary | ICD-10-CM | POA: Diagnosis not present

## 2020-04-22 DIAGNOSIS — C61 Malignant neoplasm of prostate: Secondary | ICD-10-CM | POA: Diagnosis not present

## 2020-04-22 DIAGNOSIS — N138 Other obstructive and reflux uropathy: Secondary | ICD-10-CM | POA: Diagnosis not present

## 2020-05-06 DIAGNOSIS — N138 Other obstructive and reflux uropathy: Secondary | ICD-10-CM | POA: Diagnosis not present

## 2020-05-06 DIAGNOSIS — N401 Enlarged prostate with lower urinary tract symptoms: Secondary | ICD-10-CM | POA: Diagnosis not present

## 2020-05-06 DIAGNOSIS — C61 Malignant neoplasm of prostate: Secondary | ICD-10-CM | POA: Diagnosis not present

## 2020-05-08 ENCOUNTER — Telehealth: Payer: Self-pay

## 2020-05-08 ENCOUNTER — Other Ambulatory Visit: Payer: Self-pay | Admitting: *Deleted

## 2020-05-08 DIAGNOSIS — E1140 Type 2 diabetes mellitus with diabetic neuropathy, unspecified: Secondary | ICD-10-CM

## 2020-05-08 DIAGNOSIS — F321 Major depressive disorder, single episode, moderate: Secondary | ICD-10-CM

## 2020-05-08 MED ORDER — BUPROPION HCL ER (XL) 300 MG PO TB24: 300.0000 mg | ORAL_TABLET | Freq: Every day | ORAL | 0 refills | Status: DC

## 2020-05-08 MED ORDER — TRULICITY 3 MG/0.5ML ~~LOC~~ SOAJ: 3.0000 mg | SUBCUTANEOUS | 3 refills | Status: DC

## 2020-05-08 MED ORDER — GLIPIZIDE ER 5 MG PO TB24: 5.0000 mg | ORAL_TABLET | Freq: Every day | ORAL | 1 refills | Status: DC

## 2020-05-08 MED ORDER — MELATONIN 5 MG PO TABS: 5.0000 mg | ORAL_TABLET | Freq: Every day | ORAL | 1 refills | Status: AC

## 2020-05-08 NOTE — Telephone Encounter (Signed)
Ivin Booty called with Trulicity questions and to change her dads pharmacy.

## 2020-05-08 NOTE — Telephone Encounter (Signed)
[facility] called and stated that patient is transferring Rx to their pharmacy and he needs a refill.  Pharmacy updated and faxed Rx.

## 2020-05-19 ENCOUNTER — Other Ambulatory Visit: Payer: Self-pay | Admitting: Internal Medicine

## 2020-06-09 ENCOUNTER — Encounter: Payer: Self-pay | Admitting: Physician Assistant

## 2020-07-16 ENCOUNTER — Ambulatory Visit: Payer: BC Managed Care – PPO | Admitting: Physician Assistant

## 2020-08-10 ENCOUNTER — Other Ambulatory Visit: Payer: Self-pay | Admitting: *Deleted

## 2020-08-10 DIAGNOSIS — F321 Major depressive disorder, single episode, moderate: Secondary | ICD-10-CM

## 2020-08-10 MED ORDER — BUPROPION HCL ER (XL) 300 MG PO TB24: 300.0000 mg | ORAL_TABLET | Freq: Every day | ORAL | 1 refills | Status: DC

## 2020-08-10 NOTE — Telephone Encounter (Signed)
Received refill Request from pharmacy.  

## 2020-08-17 ENCOUNTER — Encounter: Payer: Self-pay | Admitting: Internal Medicine

## 2020-08-17 ENCOUNTER — Ambulatory Visit (INDEPENDENT_AMBULATORY_CARE_PROVIDER_SITE_OTHER): Payer: BC Managed Care – PPO | Admitting: Internal Medicine

## 2020-08-17 ENCOUNTER — Other Ambulatory Visit: Payer: Self-pay

## 2020-08-17 VITALS — BP 118/62 | HR 81 | Temp 97.0°F | Ht 69.0 in | Wt 313.0 lb

## 2020-08-17 DIAGNOSIS — G3183 Dementia with Lewy bodies: Secondary | ICD-10-CM | POA: Diagnosis not present

## 2020-08-17 DIAGNOSIS — L723 Sebaceous cyst: Secondary | ICD-10-CM | POA: Diagnosis not present

## 2020-08-17 DIAGNOSIS — F02818 Dementia in other diseases classified elsewhere, unspecified severity, with other behavioral disturbance: Secondary | ICD-10-CM

## 2020-08-17 DIAGNOSIS — E1140 Type 2 diabetes mellitus with diabetic neuropathy, unspecified: Secondary | ICD-10-CM | POA: Diagnosis not present

## 2020-08-17 DIAGNOSIS — C61 Malignant neoplasm of prostate: Secondary | ICD-10-CM | POA: Diagnosis not present

## 2020-08-17 DIAGNOSIS — F4310 Post-traumatic stress disorder, unspecified: Secondary | ICD-10-CM

## 2020-08-17 DIAGNOSIS — R296 Repeated falls: Secondary | ICD-10-CM

## 2020-08-17 DIAGNOSIS — F0281 Dementia in other diseases classified elsewhere with behavioral disturbance: Secondary | ICD-10-CM

## 2020-08-17 MED ORDER — FREESTYLE LIBRE 14 DAY SENSOR MISC: 1.0000 | Freq: Two times a day (BID) | 11 refills | Status: AC

## 2020-08-17 MED ORDER — FREESTYLE LIBRE 14 DAY READER DEVI: 1.0000 | Freq: Two times a day (BID) | 0 refills | Status: AC

## 2020-08-17 NOTE — Progress Notes (Signed)
Location:  [facility] clinic Provider:  Shelbia Scinto L. Mariea Clonts, D.O., C.M.D.  Goals of Care:  Advanced Directives 08/17/2020  Does Patient Have a Medical Advance Directive? Yes  Type of Advance Directive Living will;Healthcare Power of Attorney  Does patient want to make changes to medical advance directive? No - Patient declined  Copy of [place] in Chart? No - copy requested  Would patient like information on creating a medical advance directive? -  Pre-existing out of facility DNR order (yellow form or pink MOST form) -     Chief Complaint  Patient presents with  . Medical Management of Chronic Issues    4 month follow up   . Acute Visit    trimmers are worse and questions about falling   . Health Maintenance    foot exam, influenza, and colonoscopy    HPI: Patient is a 64 y.o. male seen today for medical management of chronic diseases.    His tremors are worse.  He's having neuropathy of his hands, feeling puffy. Also in right foot.   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  His aricept has helped in a lot of ways.  The nightmares were there before he started on it.  Jarrett Soho thinks his memory is clearer.  He has not been lost lately.  He's wearing his hearing aids when he needs to talk to people.    He had one episode where his wife found him on the bedroom floor.  He fell in some way.  Thinks he was on the way to the bathroom.  He fell back to sleep on the floor and Ivin Booty had to arouse him.  They sleep upstairs.  He had a trip over a stair and over the pug dog--Susie--he hit his left shoulder that he hurt before.     He has lost weight down to 313.6 lbs.  He's still not checking his blood sugars.    Prostate biopsy was stable.    Diarrhea is not as bad as it used to be.  He's regular now.  No longer having upper abdominal pain like he did.    They never made it to dermatology to have cyst excised.  He missed his appt with me to have it done.     Past Medical History:  Diagnosis Date  . B12 deficiency    Per New Patient Packet   . Barrett esophagus    Per New Patient Packet   . Depression    Per New Patient Packet   . Diabetes [facility])    Per New Patient Packet   . Fatty liver    Per New Patient Packet   . GERD (gastroesophageal reflux disease)   . Gout    Per New Patient Packet   . History of cardiac cath 2014   Dr.Berry, Per New Patient Packet   . History of esophagogastroduodenoscopy (EGD)    Per New Patient Packet   . History of tobacco use   . HLD (hyperlipidemia)   . HOH (hard of hearing)    Per New Patient Packet   . HTN (hypertension)   . Memory loss    Per New Patient Packet   . Numbness and tingling of both feet    Per New Patient Packet   . Obesity   . OSA (obstructive sleep apnea)    Per New Patient Packet, No CPAP  . Prostate cancer [facility]    Per New Patient Packet   .  Rheumatoid arthritis [facility])    Per New Patient Packet   . Tremor    Per New Patient Packet   . Unsteady gait    Per New Patient Packet     Past Surgical History:  Procedure Laterality Date  . KNEE SURGERY Right    Per New Patient Packet   . LEFT HEART CATHETERIZATION WITH CORONARY ANGIOGRAM N/A 06/04/2013   Procedure: LEFT HEART CATHETERIZATION WITH CORONARY ANGIOGRAM;  Surgeon: Lorretta Harp, MD;  Location: [facility] CATH LAB;  Service: Cardiovascular;  Laterality: N/A;  . TRANSURETHRAL RESECTION OF PROSTATE  05/23/2018   Per New Patient Packet, [place]   . UPPER GASTROINTESTINAL ENDOSCOPY  2001   Per New Patient Packet   . VASECTOMY  1984   Per New Patient Packet     Allergies  Allergen Reactions  . Bee Venom Anaphylaxis    Outpatient Encounter Medications as of 08/17/2020  Medication Sig  . allopurinol (ZYLOPRIM) 300 MG tablet Take 300 mg by mouth daily.  Marland Kitchen atorvastatin (LIPITOR) 40 MG tablet Take 1 tablet (40 mg total) by mouth daily.  Marland Kitchen buPROPion (WELLBUTRIN XL) 300 MG 24 hr tablet Take 1 tablet (300 mg total) by  mouth daily.  . Cholecalciferol (VITAMIN D3) 1.25 MG (50000 UT) CAPS Take by mouth.  . dexlansoprazole (DEXILANT) 60 MG capsule Take 1 capsule (60 mg total) by mouth daily as needed.  . donepezil (ARICEPT) 5 MG tablet TAKE 1 TABLET NIGHTLY AT BEDTIME  . doxycycline (VIBRA-TABS) 100 MG tablet Take 1 tablet (100 mg total) by mouth 2 (two) times daily.  . Dulaglutide (TRULICITY) 3 KZ/6.0FU SOPN Inject 3 mg into the skin once a week.  Marland Kitchen ELDERBERRY PO Take by mouth.  Marland Kitchen glipiZIDE (GLUCOTROL XL) 5 MG 24 hr tablet TAKE 1 TABLET BY MOUTH DAILY WITH BREAKFAST  . glucose blood test strip One Touch Verio Test Strips. Use to test blood sugar twice daily or as directed. Dx: E11.40  . lisinopril (ZESTRIL) 20 MG tablet TAKE 1 TABLET BY MOUTH DAILY  . LORazepam (ATIVAN) 0.5 MG tablet Take 0.5 tablets (0.25 mg total) by mouth 3 (three) times daily as needed for anxiety.  . melatonin 5 MG TABS Take 1 tablet (5 mg total) by mouth at bedtime.  . metFORMIN (GLUCOPHAGE) 1000 MG tablet TAKE 1 TABLET BY MOUTH TWICE DAILY WITH MEALS  . nitroGLYCERIN (NITROSTAT) 0.4 MG SL tablet Place 1 tablet (0.4 mg total) under the tongue every 5 (five) minutes as needed for chest pain.  . pantoprazole (PROTONIX) 40 MG tablet Take 1 tablet (40 mg total) by mouth daily.  . Turmeric Curcumin 500 MG CAPS Take by mouth daily.  . vitamin B-12 (CYANOCOBALAMIN) 1000 MCG tablet Take 1,000 mcg by mouth daily.   Marland Kitchen zinc gluconate 50 MG tablet Take 50 mg by mouth daily.   No facility-administered encounter medications on file as of 08/17/2020.    Review of Systems:  Review of Systems  Constitutional: Negative for chills, fever and malaise/fatigue.  HENT: Positive for hearing loss.   Eyes: Negative for blurred vision.       New glasses  Respiratory: Negative for cough and shortness of breath.   Cardiovascular: Negative for chest pain, palpitations and leg swelling.  Gastrointestinal: Negative for abdominal pain and constipation.    Genitourinary: Negative for dysuria.  Musculoskeletal: Positive for falls and neck pain.  Skin: Negative for itching and rash.  Neurological: Positive for tingling, tremors and sensory change. Negative for dizziness and loss of consciousness.  Psychiatric/Behavioral: Positive for depression and memory loss. The patient is nervous/anxious. The patient does not have insomnia.     Health Maintenance  Topic Date Due  . FOOT EXAM  Never done  . COLONOSCOPY  Never done  . INFLUENZA VACCINE  07/19/2020  . HEMOGLOBIN A1C  10/13/2020  . OPHTHALMOLOGY EXAM  12/19/2020  . TETANUS/TDAP  09/18/2024  . PNEUMOCOCCAL POLYSACCHARIDE VACCINE AGE 80-64 HIGH RISK  Completed  . COVID-19 Vaccine  Completed  . Hepatitis C Screening  Completed  . HIV Screening  Completed    Physical Exam: Vitals:   08/17/20 1314  BP: 118/62  Pulse: 81  Temp: (!) 97 F (36.1 C)  TempSrc: Temporal  SpO2: 97%  Weight: (!) 313 lb (142 kg)  Height: 5\' 9"  (1.753 m)   Body mass index is 46.22 kg/m. Physical Exam Vitals reviewed.  Constitutional:      General: He is not in acute distress.    Appearance: He is obese. He is not toxic-appearing.  HENT:     Head: Normocephalic and atraumatic.     Ears:     Comments: HOH, wearing hearing aids that are beeping loudly Eyes:     Comments: New glasses  Cardiovascular:     Rate and Rhythm: Normal rate and regular rhythm.     Pulses: Normal pulses.     Heart sounds: Normal heart sounds.  Pulmonary:     Effort: Pulmonary effort is normal.     Breath sounds: Normal breath sounds.  Abdominal:     General: Bowel sounds are normal.  Musculoskeletal:        General: Normal range of motion.     Cervical back: Neck supple.  Neurological:     Mental Status: He is alert. Mental status is at baseline.     Comments: Ambulates with walking stick, struggles to get up out of chair   Psychiatric:        Mood and Affect: Mood normal.     Labs reviewed: Basic Metabolic  Panel: Recent Labs    08/29/19 1415 12/02/19 1426 04/13/20 1410  NA 139 138 140  K 3.9 4.2 4.4  CL 104 101 104  CO2 24 26 25   GLUCOSE 224* 124* 133*  BUN 18 17 17   CREATININE 1.02 0.90 0.94  CALCIUM 9.6 9.6 9.4   Liver Function Tests: Recent Labs    12/02/19 1426 04/13/20 1410  AST 14 15  ALT 19 23  BILITOT 0.8 0.4  PROT 6.8 6.4   No results for input(s): LIPASE, AMYLASE in the last 8760 hours. No results for input(s): AMMONIA in the last 8760 hours. CBC: Recent Labs    12/02/19 1426 04/13/20 1410  WBC 7.6 7.8  NEUTROABS 4,476 4,485  HGB 14.1 14.5  HCT 42.0 43.0  MCV 86.1 87.8  PLT 220 230   Lipid Panel: No results for input(s): CHOL, HDL, LDLCALC, TRIG, CHOLHDL, LDLDIRECT in the last 8760 hours. Lab Results  Component Value Date   HGBA1C 7.8 (H) 04/13/2020    Assessment/Plan 1. Lewy body dementia with behavioral disturbance ([place]) -seems to be fairly stable and more bothered by PTSD symptoms and doing better with memory -cont same regimen and monitor  2. Type 2 diabetes mellitus with diabetic neuropathy, without long-term current use of insulin (HCC) - will try to get him freestyle libre in hopes it will help encourage better eating habits when he can check glucose w/o sticking himself - Basic metabolic panel - Hemoglobin A1c - CBC  with Differential/Platelet - Continuous Blood Gluc Receiver (FREESTYLE LIBRE 14 DAY READER) DEVI; Inject 1 Device as directed 2 (two) times daily.  Dispense: 1 each; Refill: 0 - Continuous Blood Gluc Sensor (FREESTYLE LIBRE 14 DAY SENSOR) MISC; 1 Device by Other route 2 (two) times daily.  Dispense: 2 each; Refill: 11  3. PTSD (post-traumatic stress disorder) -may need to consider different treatment for this if nightmares/flashbacks continue  4. Prostate cancer ([place]) - stable biopsy, requests PSA to be rechecked already - PSA  5. Inflamed sebaceous cyst - ok to schedule appt purely for this to be removed on buttocks -  CBC with Differential/Platelet  6. Frequent falls -continues to struggle with this b/w his neuropathy and sometimes not making good decisions  Labs/tests ordered:   Lab Orders     Basic metabolic panel     Hemoglobin A1c     CBC with Differential/Platelet     PSA  Next appt:  4 mos med mgt, may schedule appt for cyst to be removed  Dale Ribeiro L. Jashawna Reever, D.O. [facility] [address], [place] [address] Cell Phone (Mon-Fri 8am-5pm):  [phone] On Call:  [phone] & follow prompts after 5pm & weekends Office Phone:  [phone] Office Fax:  [phone]

## 2020-08-18 ENCOUNTER — Other Ambulatory Visit: Payer: Self-pay

## 2020-08-18 DIAGNOSIS — R251 Tremor, unspecified: Secondary | ICD-10-CM

## 2020-08-18 DIAGNOSIS — F4312 Post-traumatic stress disorder, chronic: Secondary | ICD-10-CM

## 2020-08-18 DIAGNOSIS — E1140 Type 2 diabetes mellitus with diabetic neuropathy, unspecified: Secondary | ICD-10-CM

## 2020-08-18 DIAGNOSIS — F321 Major depressive disorder, single episode, moderate: Secondary | ICD-10-CM

## 2020-08-18 LAB — BASIC METABOLIC PANEL
BUN: 21 mg/dL (ref 7–25)
CO2: 23 mmol/L (ref 20–32)
Calcium: 9.6 mg/dL (ref 8.6–10.3)
Chloride: 102 mmol/L (ref 98–110)
Creat: 0.96 mg/dL (ref 0.70–1.25)
Glucose, Bld: 170 mg/dL — ABNORMAL HIGH (ref 65–139)
Potassium: 4.2 mmol/L (ref 3.5–5.3)
Sodium: 136 mmol/L (ref 135–146)

## 2020-08-18 LAB — CBC WITH DIFFERENTIAL/PLATELET
Absolute Monocytes: 599 cells/uL (ref 200–950)
Basophils Absolute: 33 cells/uL (ref 0–200)
Basophils Relative: 0.4 %
Eosinophils Absolute: 148 cells/uL (ref 15–500)
Eosinophils Relative: 1.8 %
HCT: 44.4 % (ref 38.5–50.0)
Hemoglobin: 15 g/dL (ref 13.2–17.1)
Lymphs Abs: 2181 cells/uL (ref 850–3900)
MCH: 29.8 pg (ref 27.0–33.0)
MCHC: 33.8 g/dL (ref 32.0–36.0)
MCV: 88.3 fL (ref 80.0–100.0)
MPV: 10.7 fL (ref 7.5–12.5)
Monocytes Relative: 7.3 %
Neutro Abs: 5240 cells/uL (ref 1500–7800)
Neutrophils Relative %: 63.9 %
Platelets: 223 10*3/uL (ref 140–400)
RBC: 5.03 10*6/uL (ref 4.20–5.80)
RDW: 12.3 % (ref 11.0–15.0)
Total Lymphocyte: 26.6 %
WBC: 8.2 10*3/uL (ref 3.8–10.8)

## 2020-08-18 LAB — HEMOGLOBIN A1C
Hgb A1c MFr Bld: 8.2 % of total Hgb — ABNORMAL HIGH (ref <5.7)
Mean Plasma Glucose: 189 (calc)
eAG (mmol/L): 10.4 (calc)

## 2020-08-18 LAB — PSA: PSA: 0.6 ng/mL (ref <=4.0)

## 2020-08-18 MED ORDER — TRAMADOL HCL 50 MG PO TABS
50.0000 mg | ORAL_TABLET | Freq: Every evening | ORAL | 0 refills | Status: AC
Start: 2020-08-18 — End: 2020-08-23

## 2020-08-18 MED ORDER — LORAZEPAM 0.5 MG PO TABS: 0.2500 mg | ORAL_TABLET | Freq: Three times a day (TID) | ORAL | 1 refills | Status: DC | PRN

## 2020-08-18 MED ORDER — TRULICITY 4.5 MG/0.5ML ~~LOC~~ SOAJ: 4.5000 mg | SUBCUTANEOUS | 3 refills | Status: DC

## 2020-08-18 NOTE — Progress Notes (Signed)
Blood counts are normal. PSA trended up just by 0.1 to 0.6 Electrolytes and kidneys are ok Sugar average is up to 8.2 again from 7.8 We could go up to 4.5mg  weekly of his trulicity to get this back down.  The trulicity is probably why he lost weight.

## 2020-08-18 NOTE — Progress Notes (Signed)
Approved the requested renewals and trulicity increase

## 2020-09-04 ENCOUNTER — Ambulatory Visit: Payer: Self-pay | Admitting: Physician Assistant

## 2020-09-04 ENCOUNTER — Encounter: Payer: Self-pay | Admitting: Internal Medicine

## 2020-10-19 ENCOUNTER — Other Ambulatory Visit: Payer: Self-pay

## 2020-10-19 ENCOUNTER — Encounter: Payer: Self-pay | Admitting: Internal Medicine

## 2020-10-19 ENCOUNTER — Ambulatory Visit (INDEPENDENT_AMBULATORY_CARE_PROVIDER_SITE_OTHER): Payer: BC Managed Care – PPO | Admitting: Internal Medicine

## 2020-10-19 VITALS — BP 122/84 | HR 65 | Temp 98.0°F | Ht 69.0 in | Wt 307.0 lb

## 2020-10-19 DIAGNOSIS — G3183 Dementia with Lewy bodies: Secondary | ICD-10-CM | POA: Diagnosis not present

## 2020-10-19 DIAGNOSIS — F0281 Dementia in other diseases classified elsewhere with behavioral disturbance: Secondary | ICD-10-CM

## 2020-10-19 DIAGNOSIS — L723 Sebaceous cyst: Secondary | ICD-10-CM

## 2020-10-19 DIAGNOSIS — F4310 Post-traumatic stress disorder, unspecified: Secondary | ICD-10-CM | POA: Diagnosis not present

## 2020-10-19 DIAGNOSIS — F02818 Dementia in other diseases classified elsewhere, unspecified severity, with other behavioral disturbance: Secondary | ICD-10-CM

## 2020-10-19 NOTE — Progress Notes (Signed)
Location:  [facility] clinic Provider: Asante Ritacco L. Mariea Clonts, D.O., C.M.D.  Goals of Care:  Advanced Directives 10/19/2020  Does Patient Have a Medical Advance Directive? No  Type of Advance Directive -  Does patient want to make changes to medical advance directive? -  Copy of [place] in Chart? -  Would patient like information on creating a medical advance directive? Yes (ED - Information included in AVS)  Pre-existing out of facility DNR order (yellow form or pink MOST form) -     Chief Complaint  Patient presents with  . Acute Visit    Cyst removal  measured at 1x1.5cm, ACP paperwork .    HPI: Patient is a 64 y.o. male seen today for an acute visit for I/D of cyst of buttocks.  Spelling and math harder.  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  Now staying focused is harder.  Given ACP paperwork again.   Past Medical History:  Diagnosis Date  . B12 deficiency    Per New Patient Packet   . Barrett esophagus    Per New Patient Packet   . Depression    Per New Patient Packet   . Diabetes [facility])    Per New Patient Packet   . Fatty liver    Per New Patient Packet   . GERD (gastroesophageal reflux disease)   . Gout    Per New Patient Packet   . History of cardiac cath 2014   Dr.Berry, Per New Patient Packet   . History of esophagogastroduodenoscopy (EGD)    Per New Patient Packet   . History of tobacco use   . HLD (hyperlipidemia)   . HOH (hard of hearing)    Per New Patient Packet   . HTN (hypertension)   . Memory loss    Per New Patient Packet   . Numbness and tingling of both feet    Per New Patient Packet   . Obesity   . OSA (obstructive sleep apnea)    Per New Patient Packet, No CPAP  . Prostate cancer [facility])    Per New Patient Packet   . Rheumatoid arthritis [facility])    Per New Patient Packet   . Tremor    Per New Patient Packet   . Unsteady gait    Per New Patient Packet     Past Surgical History:   Procedure Laterality Date  . KNEE SURGERY Right    Per New Patient Packet   . LEFT HEART CATHETERIZATION WITH CORONARY ANGIOGRAM N/A 06/04/2013   Procedure: LEFT HEART CATHETERIZATION WITH CORONARY ANGIOGRAM;  Surgeon: Lorretta Harp, MD;  Location: [facility] CATH LAB;  Service: Cardiovascular;  Laterality: N/A;  . TRANSURETHRAL RESECTION OF PROSTATE  05/23/2018   Per New Patient Packet, [place]   . UPPER GASTROINTESTINAL ENDOSCOPY  2001   Per New Patient Packet   . VASECTOMY  1984   Per New Patient Packet     Allergies  Allergen Reactions  . Bee Venom Anaphylaxis    Outpatient Encounter Medications as of 10/19/2020  Medication Sig  . allopurinol (ZYLOPRIM) 300 MG tablet Take 300 mg by mouth daily.  Marland Kitchen atorvastatin (LIPITOR) 40 MG tablet Take 1 tablet (40 mg total) by mouth daily.  Marland Kitchen buPROPion (WELLBUTRIN XL) 300 MG 24 hr tablet Take 1 tablet (300 mg total) by mouth daily.  . Cholecalciferol (VITAMIN D3) 1.25 MG (50000 UT) CAPS Take by mouth.  Marland Kitchen  Continuous Blood Gluc Receiver (FREESTYLE LIBRE 14 DAY READER) DEVI Inject 1 Device as directed 2 (two) times daily.  . Continuous Blood Gluc Sensor (FREESTYLE LIBRE 14 DAY SENSOR) MISC 1 Device by Other route 2 (two) times daily.  Marland Kitchen dexlansoprazole (DEXILANT) 60 MG capsule Take 1 capsule (60 mg total) by mouth daily as needed.  . donepezil (ARICEPT) 5 MG tablet TAKE 1 TABLET NIGHTLY AT BEDTIME  . doxycycline (VIBRA-TABS) 100 MG tablet Take 1 tablet (100 mg total) by mouth 2 (two) times daily.  . Dulaglutide (TRULICITY) 4.5 XI/3.3AS SOPN Inject 0.5 mLs (4.5 mg total) as directed once a week.  Marland Kitchen ELDERBERRY PO Take by mouth.  Marland Kitchen glipiZIDE (GLUCOTROL XL) 5 MG 24 hr tablet TAKE 1 TABLET BY MOUTH DAILY WITH BREAKFAST  . glucose blood test strip One Touch Verio Test Strips. Use to test blood sugar twice daily or as directed. Dx: E11.40  . lisinopril (ZESTRIL) 20 MG tablet TAKE 1 TABLET BY MOUTH DAILY  . LORazepam (ATIVAN) 0.5 MG tablet Take 0.5  tablets (0.25 mg total) by mouth 3 (three) times daily as needed for anxiety.  . melatonin 5 MG TABS Take 1 tablet (5 mg total) by mouth at bedtime.  . metFORMIN (GLUCOPHAGE) 1000 MG tablet TAKE 1 TABLET BY MOUTH TWICE DAILY WITH MEALS  . nitroGLYCERIN (NITROSTAT) 0.4 MG SL tablet Place 1 tablet (0.4 mg total) under the tongue every 5 (five) minutes as needed for chest pain.  . pantoprazole (PROTONIX) 40 MG tablet Take 1 tablet (40 mg total) by mouth daily.  . Turmeric Curcumin 500 MG CAPS Take by mouth daily.  . vitamin B-12 (CYANOCOBALAMIN) 1000 MCG tablet Take 1,000 mcg by mouth daily.   Marland Kitchen zinc gluconate 50 MG tablet Take 50 mg by mouth daily.   No facility-administered encounter medications on file as of 10/19/2020.    Review of Systems:  Review of Systems  Constitutional: Negative for chills and fever.  HENT: Positive for hearing loss.   Eyes:       Glasses  Respiratory: Negative for cough and shortness of breath.   Cardiovascular: Negative for chest pain, palpitations and leg swelling.  Gastrointestinal: Negative for abdominal pain, blood in stool, constipation, diarrhea and melena.  Genitourinary: Negative for dysuria.  Musculoskeletal: Positive for falls. Negative for joint pain.  Neurological: Negative for dizziness and loss of consciousness.  Psychiatric/Behavioral: Positive for memory loss. The patient has insomnia.        Nightmares    Health Maintenance  Topic Date Due  . FOOT EXAM  Never done  . COLONOSCOPY  Never done  . INFLUENZA VACCINE  07/19/2020  . OPHTHALMOLOGY EXAM  12/19/2020  . HEMOGLOBIN A1C  02/15/2021  . TETANUS/TDAP  09/18/2024  . PNEUMOCOCCAL POLYSACCHARIDE VACCINE AGE 51-64 HIGH RISK  Completed  . COVID-19 Vaccine  Completed  . Hepatitis C Screening  Completed  . HIV Screening  Completed    Physical Exam: Vitals:   10/19/20 1309  BP: 122/84  Pulse: 65  Temp: 98 F (36.7 C)  SpO2: 97%  Weight: (!) 307 lb (139.3 kg)  Height: 5\' 9"  (1.753 m)    Body mass index is 45.34 kg/m. Physical Exam Vitals reviewed.  Constitutional:      Appearance: Normal appearance. He is obese.  HENT:     Head: Normocephalic and atraumatic.  Eyes:     Comments: glasses  Pulmonary:     Effort: Pulmonary effort is normal.  Musculoskeletal:  General: Normal range of motion.  Skin:    Comments: Left buttock 1cm purple cyst  Neurological:     General: No focal deficit present.     Mental Status: He is alert.  Psychiatric:        Mood and Affect: Mood normal.        Behavior: Behavior normal.     Labs reviewed: Basic Metabolic Panel: Recent Labs    12/02/19 1426 04/13/20 1410 08/17/20 1357  NA 138 140 136  K 4.2 4.4 4.2  CL 101 104 102  CO2 26 25 23   GLUCOSE 124* 133* 170*  BUN 17 17 21   CREATININE 0.90 0.94 0.96  CALCIUM 9.6 9.4 9.6   Liver Function Tests: Recent Labs    12/02/19 1426 04/13/20 1410  AST 14 15  ALT 19 23  BILITOT 0.8 0.4  PROT 6.8 6.4   No results for input(s): LIPASE, AMYLASE in the last 8760 hours. No results for input(s): AMMONIA in the last 8760 hours. CBC: Recent Labs    12/02/19 1426 04/13/20 1410 08/17/20 1357  WBC 7.6 7.8 8.2  NEUTROABS 4,476 4,485 5,240  HGB 14.1 14.5 15.0  HCT 42.0 43.0 44.4  MCV 86.1 87.8 88.3  PLT 220 230 223   Lipid Panel: No results for input(s): CHOL, HDL, LDLCALC, TRIG, CHOLHDL, LDLDIRECT in the last 8760 hours. Lab Results  Component Value Date   HGBA1C 8.2 (H) 08/17/2020    Procedures since last visit: No results found.  Assessment/Plan 1. Inflamed sebaceous cyst -incision and drainage and extraction of capsule performed today -cleansed with betadine, sprayed with bupivicaine topical and then also numbed with 3 total cc of 1% lidocaine, then small incision made across cyst and chunky yellow material and casing removed  2. Lewy body dementia with behavioral disturbance ([place]) -is now getting up out of bed at night and looking for his uniform to go  to work (happened at least once)  3. PTSD (post-traumatic stress disorder) -up to 15mg  melatonin at hs which he thinks may be helping -reports two episodes nightmares over a few months  Labs/tests ordered:  No new Next appt:  12/10/2020  Kermit Arnette L. Stormy Connon, D.O. [facility] [address], [place] [address] Cell Phone (Mon-Fri 8am-5pm):  [phone] On Call:  [phone] & follow prompts after 5pm & weekends Office Phone:  [phone] Office Fax:  [phone]

## 2020-10-23 ENCOUNTER — Other Ambulatory Visit: Payer: Self-pay

## 2020-10-23 MED ORDER — LISINOPRIL 20 MG PO TABS
20.0000 mg | ORAL_TABLET | Freq: Every day | ORAL | 1 refills | Status: DC
Start: 2020-10-23 — End: 2021-01-25

## 2020-11-17 ENCOUNTER — Other Ambulatory Visit: Payer: Self-pay

## 2020-11-17 DIAGNOSIS — G3183 Dementia with Lewy bodies: Secondary | ICD-10-CM

## 2020-11-17 DIAGNOSIS — F0281 Dementia in other diseases classified elsewhere with behavioral disturbance: Secondary | ICD-10-CM

## 2020-11-17 MED ORDER — DONEPEZIL HCL 5 MG PO TABS: ORAL_TABLET | ORAL | 1 refills | Status: DC

## 2020-11-28 DIAGNOSIS — K2270 Barrett's esophagus without dysplasia: Secondary | ICD-10-CM | POA: Diagnosis not present

## 2020-11-28 DIAGNOSIS — Z86010 Personal history of colonic polyps: Secondary | ICD-10-CM | POA: Diagnosis not present

## 2020-11-28 DIAGNOSIS — Z23 Encounter for immunization: Secondary | ICD-10-CM | POA: Diagnosis not present

## 2020-11-28 DIAGNOSIS — Z79899 Other long term (current) drug therapy: Secondary | ICD-10-CM | POA: Diagnosis not present

## 2020-11-30 ENCOUNTER — Other Ambulatory Visit: Payer: Self-pay | Admitting: *Deleted

## 2020-11-30 MED ORDER — PANTOPRAZOLE SODIUM 40 MG PO TBEC
40.0000 mg | DELAYED_RELEASE_TABLET | Freq: Every day | ORAL | 1 refills | Status: DC
Start: 2020-11-30 — End: 2021-04-29

## 2020-11-30 NOTE — Telephone Encounter (Signed)
Received refill Request from [facility].  

## 2020-12-10 ENCOUNTER — Other Ambulatory Visit: Payer: BC Managed Care – PPO

## 2020-12-10 ENCOUNTER — Ambulatory Visit: Payer: BC Managed Care – PPO | Admitting: Internal Medicine

## 2020-12-14 ENCOUNTER — Ambulatory Visit: Payer: BC Managed Care – PPO | Admitting: Internal Medicine

## 2020-12-15 DIAGNOSIS — Z01818 Encounter for other preprocedural examination: Secondary | ICD-10-CM | POA: Diagnosis not present

## 2020-12-15 DIAGNOSIS — K635 Polyp of colon: Secondary | ICD-10-CM | POA: Diagnosis not present

## 2020-12-15 DIAGNOSIS — K2270 Barrett's esophagus without dysplasia: Secondary | ICD-10-CM | POA: Diagnosis not present

## 2020-12-15 DIAGNOSIS — Z86010 Personal history of colonic polyps: Secondary | ICD-10-CM | POA: Diagnosis not present

## 2020-12-15 DIAGNOSIS — Z1211 Encounter for screening for malignant neoplasm of colon: Secondary | ICD-10-CM | POA: Diagnosis not present

## 2020-12-17 ENCOUNTER — Ambulatory Visit: Payer: BC Managed Care – PPO | Admitting: Internal Medicine

## 2020-12-25 DIAGNOSIS — K635 Polyp of colon: Secondary | ICD-10-CM | POA: Diagnosis not present

## 2020-12-25 DIAGNOSIS — A048 Other specified bacterial intestinal infections: Secondary | ICD-10-CM | POA: Diagnosis not present

## 2020-12-25 DIAGNOSIS — K2970 Gastritis, unspecified, without bleeding: Secondary | ICD-10-CM | POA: Diagnosis not present

## 2020-12-25 DIAGNOSIS — K900 Celiac disease: Secondary | ICD-10-CM | POA: Diagnosis not present

## 2020-12-25 DIAGNOSIS — K2270 Barrett's esophagus without dysplasia: Secondary | ICD-10-CM | POA: Diagnosis not present

## 2020-12-28 ENCOUNTER — Telehealth: Payer: Self-pay

## 2020-12-28 MED ORDER — ATORVASTATIN CALCIUM 40 MG PO TABS
40.0000 mg | ORAL_TABLET | Freq: Every day | ORAL | 1 refills | Status: DC
Start: 2020-12-28 — End: 2021-04-29

## 2020-12-28 NOTE — Telephone Encounter (Signed)
Refill request received from pharmacy for Atorvastatin 40 mg tablet

## 2021-01-04 ENCOUNTER — Other Ambulatory Visit: Payer: Self-pay | Admitting: *Deleted

## 2021-01-04 DIAGNOSIS — E1140 Type 2 diabetes mellitus with diabetic neuropathy, unspecified: Secondary | ICD-10-CM

## 2021-01-04 MED ORDER — TRULICITY 4.5 MG/0.5ML ~~LOC~~ SOAJ: 4.5000 mg | SUBCUTANEOUS | 3 refills | Status: DC

## 2021-01-04 NOTE — Telephone Encounter (Signed)
[place]

## 2021-01-06 DIAGNOSIS — U071 COVID-19: Secondary | ICD-10-CM | POA: Diagnosis not present

## 2021-01-06 DIAGNOSIS — Z20822 Contact with and (suspected) exposure to covid-19: Secondary | ICD-10-CM | POA: Diagnosis not present

## 2021-01-07 ENCOUNTER — Other Ambulatory Visit: Payer: Self-pay | Admitting: Internal Medicine

## 2021-01-07 DIAGNOSIS — E1140 Type 2 diabetes mellitus with diabetic neuropathy, unspecified: Secondary | ICD-10-CM

## 2021-01-09 DIAGNOSIS — U071 COVID-19: Secondary | ICD-10-CM | POA: Diagnosis not present

## 2021-01-25 ENCOUNTER — Ambulatory Visit (INDEPENDENT_AMBULATORY_CARE_PROVIDER_SITE_OTHER): Payer: BC Managed Care – PPO | Admitting: Internal Medicine

## 2021-01-25 ENCOUNTER — Encounter: Payer: Self-pay | Admitting: Internal Medicine

## 2021-01-25 ENCOUNTER — Other Ambulatory Visit: Payer: Self-pay

## 2021-01-25 VITALS — BP 144/98 | HR 71 | Temp 97.0°F | Ht 69.0 in | Wt 299.4 lb

## 2021-01-25 DIAGNOSIS — I480 Paroxysmal atrial fibrillation: Secondary | ICD-10-CM | POA: Diagnosis not present

## 2021-01-25 DIAGNOSIS — E1140 Type 2 diabetes mellitus with diabetic neuropathy, unspecified: Secondary | ICD-10-CM | POA: Diagnosis not present

## 2021-01-25 DIAGNOSIS — E7800 Pure hypercholesterolemia, unspecified: Secondary | ICD-10-CM

## 2021-01-25 DIAGNOSIS — G3183 Dementia with Lewy bodies: Secondary | ICD-10-CM | POA: Diagnosis not present

## 2021-01-25 DIAGNOSIS — F321 Major depressive disorder, single episode, moderate: Secondary | ICD-10-CM | POA: Diagnosis not present

## 2021-01-25 DIAGNOSIS — C61 Malignant neoplasm of prostate: Secondary | ICD-10-CM

## 2021-01-25 DIAGNOSIS — F4310 Post-traumatic stress disorder, unspecified: Secondary | ICD-10-CM

## 2021-01-25 DIAGNOSIS — F0281 Dementia in other diseases classified elsewhere with behavioral disturbance: Secondary | ICD-10-CM

## 2021-01-25 MED ORDER — LISINOPRIL 20 MG PO TABS
20.0000 mg | ORAL_TABLET | Freq: Every day | ORAL | 1 refills | Status: DC
Start: 2021-01-25 — End: 2021-04-29

## 2021-01-25 NOTE — Progress Notes (Signed)
Location:  [facility] clinic Provider:  Forney Kleinpeter L. Mariea Clonts, D.O., C.M.D.  Goals of Care:  Advanced Directives 10/19/2020  Does Patient Have a Medical Advance Directive? No  Type of Advance Directive -  Does patient want to make changes to medical advance directive? -  Copy of [place] in Chart? -  Would patient like information on creating a medical advance directive? Yes (ED - Information included in AVS)  Pre-existing out of facility DNR order (yellow form or pink MOST form) -     Chief Complaint  Patient presents with  . Medical Management of Chronic Issues    Patient returns to the office for follow up. Brandon [name] would like a couple of spots on his left side of face looked at.     HPI: Patient is a 65 y.o. male seen today for medical management of chronic diseases.  Brandon [name] is here with his daughter, Jarrett Soho.  Brandon [name] wants to keep on going.  Says if Brandon [name] does get really sick, Brandon [name] does not want to suffer.  Brandon [name] was going to check the mail and forgot his cane, fell in the driveway.  Brandon [name] has a few scaly places on the side of his temple and cheeck that are scaly and dry  Brandon [name]'s under 300lbs.  Brandon [name]'s not been trying to lose weight.  Brandon [name] gets full easier.  Brandon [name] has not eaten yet today.  Brandon [name] had updated EGD/cscope 12/28.  Barrett's esophagus is there but unchanged and only one polyp out on colonoscopy at [facility].    Has to go to the bathroom within an hour.  Has diarrhea when Brandon [name] has bms.  Can't remember solid bm.  Brandon [name] doesn't like to go far from home due to this.  Is on metformin.    Brandon [name] walks up and down the street and Brandon [name] does walk around cemeteries to read headstones.    His memory is about the same.  Not too bad.  Depression has waxed and waned.  Worse with family stress.  Brandon [name] wants to get his trailer down on [facility].  Takes melatonin at night.  Brandon [name] thinks it does help.  Brandon [name] has not had a lot of nightmares.  Has had dreams with dead people in them.    Brandon [name]'s been out of his bp meds for a day or two  and bp is high.    Past Medical History:  Diagnosis Date  . B12 deficiency    Per New Patient Packet   . Barrett esophagus    Per New Patient Packet   . Depression    Per New Patient Packet   . Diabetes [facility])    Per New Patient Packet   . Fatty liver    Per New Patient Packet   . GERD (gastroesophageal reflux disease)   . Gout    Per New Patient Packet   . History of cardiac cath 2014   Dr.Berry, Per New Patient Packet   . History of esophagogastroduodenoscopy (EGD)    Per New Patient Packet   . History of tobacco use   . HLD (hyperlipidemia)   . HOH (hard of hearing)    Per New Patient Packet   . HTN (hypertension)   . Memory loss    Per New Patient Packet   . Numbness and tingling of both feet    Per New Patient Packet   . Obesity   . OSA (obstructive sleep apnea)    Per New Patient Packet, No CPAP  .  Prostate cancer [facility])    Per New Patient Packet   . Rheumatoid arthritis [facility])    Per New Patient Packet   . Tremor    Per New Patient Packet   . Unsteady gait    Per New Patient Packet     Past Surgical History:  Procedure Laterality Date  . KNEE SURGERY Right    Per New Patient Packet   . LEFT HEART CATHETERIZATION WITH CORONARY ANGIOGRAM N/A 06/04/2013   Procedure: LEFT HEART CATHETERIZATION WITH CORONARY ANGIOGRAM;  Surgeon: Lorretta Harp, MD;  Location: [facility] CATH LAB;  Service: Cardiovascular;  Laterality: N/A;  . TRANSURETHRAL RESECTION OF PROSTATE  05/23/2018   Per New Patient Packet, [place]   . UPPER GASTROINTESTINAL ENDOSCOPY  2001   Per New Patient Packet   . VASECTOMY  1984   Per New Patient Packet     Allergies  Allergen Reactions  . Bee Venom Anaphylaxis    Outpatient Encounter Medications as of 01/25/2021  Medication Sig  . allopurinol (ZYLOPRIM) 300 MG tablet Take 300 mg by mouth daily.  Marland Kitchen atorvastatin (LIPITOR) 40 MG tablet Take 1 tablet (40 mg total) by mouth daily.  Marland Kitchen buPROPion (WELLBUTRIN XL) 300 MG 24 hr tablet Take 1 tablet  (300 mg total) by mouth daily.  . Cholecalciferol (VITAMIN D3) 1.25 MG (50000 UT) CAPS Take by mouth.  . Continuous Blood Gluc Receiver (FREESTYLE LIBRE 14 DAY READER) DEVI Inject 1 Device as directed 2 (two) times daily.  . Continuous Blood Gluc Sensor (FREESTYLE LIBRE 14 DAY SENSOR) MISC 1 Device by Other route 2 (two) times daily.  Marland Kitchen donepezil (ARICEPT) 5 MG tablet TAKE 1 TABLET NIGHTLY AT BEDTIME  . Dulaglutide (TRULICITY) 4.5 0000000 SOPN Inject 4.5 mg as directed once a week.  Marland Kitchen ELDERBERRY PO Take by mouth.  Marland Kitchen glipiZIDE (GLUCOTROL XL) 5 MG 24 hr tablet TAKE 1 TABLET BY MOUTH DAILY WITH BREAKFAST  . glucose blood test strip One Touch Verio Test Strips. Use to test blood sugar twice daily or as directed. Dx: E11.40  . LORazepam (ATIVAN) 0.5 MG tablet Take 0.5 tablets (0.25 mg total) by mouth 3 (three) times daily as needed for anxiety.  . melatonin 5 MG TABS Take 1 tablet (5 mg total) by mouth at bedtime.  . metFORMIN (GLUCOPHAGE) 1000 MG tablet TAKE 1 TABLET BY MOUTH TWICE DAILY WITH MEALS  . nitroGLYCERIN (NITROSTAT) 0.4 MG SL tablet Place 1 tablet (0.4 mg total) under the tongue every 5 (five) minutes as needed for chest pain.  . pantoprazole (PROTONIX) 40 MG tablet Take 1 tablet (40 mg total) by mouth daily.  . Turmeric Curcumin 500 MG CAPS Take by mouth daily.  . vitamin B-12 (CYANOCOBALAMIN) 1000 MCG tablet Take 1,000 mcg by mouth daily.   Marland Kitchen zinc gluconate 50 MG tablet Take 50 mg by mouth daily.  . [DISCONTINUED] lisinopril (ZESTRIL) 20 MG tablet Take 1 tablet (20 mg total) by mouth daily.  Marland Kitchen lisinopril (ZESTRIL) 20 MG tablet Take 1 tablet (20 mg total) by mouth daily.  . [DISCONTINUED] dexlansoprazole (DEXILANT) 60 MG capsule Take 1 capsule (60 mg total) by mouth daily as needed.  . [DISCONTINUED] doxycycline (VIBRA-TABS) 100 MG tablet Take 1 tablet (100 mg total) by mouth 2 (two) times daily.   No facility-administered encounter medications on file as of 01/25/2021.    Review of  Systems:  Review of Systems  Constitutional: Negative for chills, fever and malaise/fatigue.  HENT: Positive for hearing loss. Negative for congestion and  sore throat.   Eyes: Negative for blurred vision.       Glasses  Respiratory: Negative for cough and shortness of breath.   Cardiovascular: Negative for chest pain, palpitations and leg swelling.  Gastrointestinal: Negative for abdominal pain, blood in stool, constipation and melena.  Genitourinary: Negative for dysuria.  Musculoskeletal: Negative for falls and joint pain.  Neurological: Negative for dizziness, loss of consciousness and weakness.  Psychiatric/Behavioral: Positive for memory loss. Negative for depression. The patient has insomnia. The patient is not nervous/anxious.     Health Maintenance  Topic Date Due  . FOOT EXAM  Never done  . COLONOSCOPY (Pts 45-45yrs Insurance coverage will need to be confirmed)  Never done  . INFLUENZA VACCINE  07/19/2020  . OPHTHALMOLOGY EXAM  12/19/2020  . HEMOGLOBIN A1C  02/15/2021  . COVID-19 Vaccine (4 - Booster for Pfizer series) 05/02/2021  . TETANUS/TDAP  09/18/2024  . Hepatitis C Screening  Completed  . HIV Screening  Completed    Physical Exam: Vitals:   01/25/21 1302  BP: (!) 144/98  Pulse: 71  Temp: (!) 97 F (36.1 C)  SpO2: 98%  Weight: 299 lb 6.4 oz (135.8 kg)  Height: 5\' 9"  (1.753 m)   Body mass index is 44.21 kg/m. Physical Exam Vitals reviewed.  Constitutional:      Appearance: Normal appearance.  HENT:     Head: Normocephalic and atraumatic.  Eyes:     Conjunctiva/sclera: Conjunctivae normal.     Pupils: Pupils are equal, round, and reactive to light.     Comments: glasses  Cardiovascular:     Rate and Rhythm: Normal rate and regular rhythm.     Pulses: Normal pulses.     Heart sounds: Normal heart sounds.  Pulmonary:     Effort: Pulmonary effort is normal.     Breath sounds: Normal breath sounds. No wheezing, rhonchi or rales.  Musculoskeletal:         General: Normal range of motion.     Cervical back: Neck supple.     Right lower leg: No edema.     Left lower leg: No edema.  Neurological:     General: No focal deficit present.     Mental Status: Brandon [name] is alert and oriented to person, place, and time.     Gait: Gait abnormal.     Comments: Talkative and friendly; uses cane  Psychiatric:        Mood and Affect: Mood normal.        Behavior: Behavior normal.     Labs reviewed: Basic Metabolic Panel: Recent Labs    04/13/20 1410 08/17/20 1357  NA 140 136  K 4.4 4.2  CL 104 102  CO2 25 23  GLUCOSE 133* 170*  BUN 17 21  CREATININE 0.94 0.96  CALCIUM 9.4 9.6   Liver Function Tests: Recent Labs    04/13/20 1410  AST 15  ALT 23  BILITOT 0.4  PROT 6.4   No results for input(s): LIPASE, AMYLASE in the last 8760 hours. No results for input(s): AMMONIA in the last 8760 hours. CBC: Recent Labs    04/13/20 1410 08/17/20 1357  WBC 7.8 8.2  NEUTROABS 4,485 5,240  HGB 14.5 15.0  HCT 43.0 44.4  MCV 87.8 88.3  PLT 230 223   Lipid Panel: No results for input(s): CHOL, HDL, LDLCALC, TRIG, CHOLHDL, LDLDIRECT in the last 8760 hours. Lab Results  Component Value Date   HGBA1C 8.2 (H) 08/17/2020    Assessment/Plan 1.  Paroxysmal atrial fibrillation (HCC) - no recent related issues - CBC with Differential/Platelet - COMPLETE METABOLIC PANEL WITH GFR  2. Depression, major, single episode, moderate (HCC) -cont wellbutrin, prn ativan  3. Type 2 diabetes mellitus with diabetic neuropathy, without long-term current use of insulin (HCC) - control is good recently, f/u hba1c today - COMPLETE METABOLIC PANEL WITH GFR - Hemoglobin A1c  4. Lewy body dementia with behavioral disturbance ([place]) -continues on aricept  5. PTSD (post-traumatic stress disorder) -no recent nightmares  6. Prostate cancer [facility]) -f/u with urology as planned, being monitored closely  7. Low-density-lipoid-type (LDL) hyperlipoproteinemia - f/u  labs, cont lipitor - Lipid panel  Labs/tests ordered:   Lab Orders     CBC with Differential/Platelet     COMPLETE METABOLIC PANEL WITH GFR     Hemoglobin A1c     Lipid panel   Next appt:  4 mos med mgt   Grady Lucci L. Syed Zukas, D.O. [facility] [address], [place] [address] Cell Phone (Mon-Fri 8am-5pm):  [phone] On Call:  [phone] & follow prompts after 5pm & weekends Office Phone:  [phone] Office Fax:  [phone]

## 2021-01-26 ENCOUNTER — Other Ambulatory Visit: Payer: Self-pay | Admitting: Internal Medicine

## 2021-01-26 DIAGNOSIS — E1140 Type 2 diabetes mellitus with diabetic neuropathy, unspecified: Secondary | ICD-10-CM

## 2021-01-26 LAB — COMPLETE METABOLIC PANEL WITH GFR
AG Ratio: 1.7 (calc) (ref 1.0–2.5)
ALT: 23 U/L (ref 9–46)
AST: 15 U/L (ref 10–35)
Albumin: 4.3 g/dL (ref 3.6–5.1)
Alkaline phosphatase (APISO): 58 U/L (ref 35–144)
BUN: 17 mg/dL (ref 7–25)
CO2: 27 mmol/L (ref 20–32)
Calcium: 9.5 mg/dL (ref 8.6–10.3)
Chloride: 102 mmol/L (ref 98–110)
Creat: 0.75 mg/dL (ref 0.70–1.25)
GFR, Est African American: 112 mL/min/{1.73_m2} (ref >=60)
GFR, Est Non African American: 97 mL/min/{1.73_m2} (ref >=60)
Globulin: 2.6 g/dL (calc) (ref 1.9–3.7)
Glucose, Bld: 143 mg/dL — ABNORMAL HIGH (ref 65–99)
Potassium: 4.1 mmol/L (ref 3.5–5.3)
Sodium: 139 mmol/L (ref 135–146)
Total Bilirubin: 0.8 mg/dL (ref 0.2–1.2)
Total Protein: 6.9 g/dL (ref 6.1–8.1)

## 2021-01-26 LAB — CBC WITH DIFFERENTIAL/PLATELET
Absolute Monocytes: 512 cells/uL (ref 200–950)
Basophils Absolute: 24 cells/uL (ref 0–200)
Basophils Relative: 0.3 %
Eosinophils Absolute: 192 cells/uL (ref 15–500)
Eosinophils Relative: 2.4 %
HCT: 44.6 % (ref 38.5–50.0)
Hemoglobin: 15.4 g/dL (ref 13.2–17.1)
Lymphs Abs: 2296 cells/uL (ref 850–3900)
MCH: 30.3 pg (ref 27.0–33.0)
MCHC: 34.5 g/dL (ref 32.0–36.0)
MCV: 87.6 fL (ref 80.0–100.0)
MPV: 10.8 fL (ref 7.5–12.5)
Monocytes Relative: 6.4 %
Neutro Abs: 4976 cells/uL (ref 1500–7800)
Neutrophils Relative %: 62.2 %
Platelets: 232 10*3/uL (ref 140–400)
RBC: 5.09 10*6/uL (ref 4.20–5.80)
RDW: 11.9 % (ref 11.0–15.0)
Total Lymphocyte: 28.7 %
WBC: 8 10*3/uL (ref 3.8–10.8)

## 2021-01-26 LAB — LIPID PANEL
Cholesterol: 117 mg/dL (ref <200)
HDL: 52 mg/dL (ref >=40)
LDL Cholesterol (Calc): 39 mg/dL (calc)
Non-HDL Cholesterol (Calc): 65 mg/dL (calc) (ref <130)
Total CHOL/HDL Ratio: 2.3 (calc) (ref <5.0)
Triglycerides: 180 mg/dL — ABNORMAL HIGH (ref <150)

## 2021-01-26 LAB — HEMOGLOBIN A1C
Hgb A1c MFr Bld: 7.8 % of total Hgb — ABNORMAL HIGH (ref <5.7)
Mean Plasma Glucose: 177 mg/dL
eAG (mmol/L): 9.8 mmol/L

## 2021-01-26 MED ORDER — METFORMIN HCL 850 MG PO TABS: 850.0000 mg | ORAL_TABLET | Freq: Two times a day (BID) | ORAL | 1 refills | Status: DC

## 2021-01-26 NOTE — Progress Notes (Signed)
Blood counts are normal Liver, kidneys and electrolytes are all ok Sugar average improved to 7.8 so let's reduce the metformin to 850mg  twice a day with meals and keep everything else the same.  This is great news!  I hope we can keep the sugars at this level even with the reduced medication but maybe fewer trips to the restroom. Cholesterol is also improved with only the triglycerides remaining above goal

## 2021-01-29 DIAGNOSIS — K2270 Barrett's esophagus without dysplasia: Secondary | ICD-10-CM | POA: Diagnosis not present

## 2021-01-29 DIAGNOSIS — D369 Benign neoplasm, unspecified site: Secondary | ICD-10-CM | POA: Diagnosis not present

## 2021-02-01 ENCOUNTER — Other Ambulatory Visit: Payer: Self-pay | Admitting: *Deleted

## 2021-02-01 DIAGNOSIS — F321 Major depressive disorder, single episode, moderate: Secondary | ICD-10-CM

## 2021-02-01 MED ORDER — BUPROPION HCL ER (XL) 300 MG PO TB24: 300.0000 mg | ORAL_TABLET | Freq: Every day | ORAL | 1 refills | Status: AC

## 2021-02-01 NOTE — Telephone Encounter (Signed)
Received refill Request from [facility].

## 2021-02-08 ENCOUNTER — Encounter: Payer: Self-pay | Admitting: Internal Medicine

## 2021-03-05 ENCOUNTER — Other Ambulatory Visit: Payer: Self-pay | Admitting: *Deleted

## 2021-03-05 MED ORDER — GLIPIZIDE ER 5 MG PO TB24: 5.0000 mg | ORAL_TABLET | Freq: Every day | ORAL | 1 refills | Status: DC

## 2021-03-05 NOTE — Telephone Encounter (Signed)
Pharmacy requested refill

## 2021-04-29 ENCOUNTER — Other Ambulatory Visit: Payer: Self-pay | Admitting: Internal Medicine

## 2021-04-29 DIAGNOSIS — F321 Major depressive disorder, single episode, moderate: Secondary | ICD-10-CM

## 2021-04-29 DIAGNOSIS — G3183 Dementia with Lewy bodies: Secondary | ICD-10-CM

## 2021-04-29 DIAGNOSIS — F0281 Dementia in other diseases classified elsewhere with behavioral disturbance: Secondary | ICD-10-CM

## 2021-04-29 DIAGNOSIS — E1140 Type 2 diabetes mellitus with diabetic neuropathy, unspecified: Secondary | ICD-10-CM

## 2021-04-29 DIAGNOSIS — I10 Essential (primary) hypertension: Secondary | ICD-10-CM

## 2021-04-29 DIAGNOSIS — F02818 Dementia in other diseases classified elsewhere, unspecified severity, with other behavioral disturbance: Secondary | ICD-10-CM

## 2021-04-29 MED ORDER — METFORMIN HCL 850 MG PO TABS: 850.0000 mg | ORAL_TABLET | Freq: Two times a day (BID) | ORAL | 3 refills | Status: AC

## 2021-04-29 MED ORDER — ATORVASTATIN CALCIUM 40 MG PO TABS: 40.0000 mg | ORAL_TABLET | Freq: Every day | ORAL | 3 refills | Status: AC

## 2021-04-29 MED ORDER — PANTOPRAZOLE SODIUM 40 MG PO TBEC: 40.0000 mg | DELAYED_RELEASE_TABLET | Freq: Every day | ORAL | 1 refills | Status: AC

## 2021-04-29 MED ORDER — DONEPEZIL HCL 5 MG PO TABS: ORAL_TABLET | ORAL | 3 refills | Status: AC

## 2021-04-29 MED ORDER — TRULICITY 4.5 MG/0.5ML ~~LOC~~ SOAJ: 4.5000 mg | SUBCUTANEOUS | 3 refills | Status: AC

## 2021-04-29 MED ORDER — LISINOPRIL 20 MG PO TABS: 20.0000 mg | ORAL_TABLET | Freq: Every day | ORAL | 3 refills | Status: AC

## 2021-04-29 MED ORDER — GLIPIZIDE ER 5 MG PO TB24: 5.0000 mg | ORAL_TABLET | Freq: Every day | ORAL | 3 refills | Status: AC

## 2021-04-30 ENCOUNTER — Other Ambulatory Visit: Payer: Self-pay | Admitting: Internal Medicine

## 2021-04-30 DIAGNOSIS — R251 Tremor, unspecified: Secondary | ICD-10-CM

## 2021-04-30 DIAGNOSIS — F321 Major depressive disorder, single episode, moderate: Secondary | ICD-10-CM

## 2021-04-30 DIAGNOSIS — F4312 Post-traumatic stress disorder, chronic: Secondary | ICD-10-CM

## 2021-04-30 MED ORDER — LORAZEPAM 0.5 MG PO TABS: 0.2500 mg | ORAL_TABLET | Freq: Three times a day (TID) | ORAL | 1 refills | Status: AC | PRN

## 2021-10-14 DIAGNOSIS — L821 Other seborrheic keratosis: Secondary | ICD-10-CM | POA: Diagnosis not present

## 2021-10-14 DIAGNOSIS — D2361 Other benign neoplasm of skin of right upper limb, including shoulder: Secondary | ICD-10-CM | POA: Diagnosis not present

## 2021-10-19 ENCOUNTER — Telehealth: Payer: BC Managed Care – PPO | Admitting: Physician Assistant

## 2021-10-19 DIAGNOSIS — L089 Local infection of the skin and subcutaneous tissue, unspecified: Secondary | ICD-10-CM

## 2021-10-19 DIAGNOSIS — E11628 Type 2 diabetes mellitus with other skin complications: Secondary | ICD-10-CM | POA: Diagnosis not present

## 2021-10-19 DIAGNOSIS — L03039 Cellulitis of unspecified toe: Secondary | ICD-10-CM

## 2021-10-19 MED ORDER — DOXYCYCLINE HYCLATE 100 MG PO CAPS: 100.0000 mg | ORAL_CAPSULE | Freq: Two times a day (BID) | ORAL | 0 refills | Status: DC

## 2021-10-19 NOTE — Progress Notes (Signed)
E Visit for Cellulitis  We are sorry that you are not feeling well. Here is how we plan to help!  Based on what you shared with me it looks like you have cellulitis.  Cellulitis looks like areas of skin redness, swelling, and warmth; it develops as a result of bacteria entering under the skin. Little red spots and/or bleeding can be seen in skin, and tiny surface sacs containing fluid can occur. Fever can be present. Cellulitis is almost always on one side of a body, and the lower limbs are the most common site of involvement.   I have prescribed:  Doxycycline -- take 1 tablet twice daily for 10 days. I encourage you to take a probiotic daily while on antibiotic and for a couple of weeks after.  Giving your history as a diabetic, I recommend follow-up with your PCP or Endocrinologist if symptoms are not improving over next 48 hours. If you note any new or worsening symptoms or any fever, you need in-person assessment ASAP. Please do not delay care!  HOME CARE:  Take your medications as ordered and take all of them, even if the skin irritation appears to be healing.   GET HELP RIGHT AWAY IF:  Symptoms that don't begin to go away within 48 hours. Severe redness persists or worsens If the area turns color, spreads or swells. If it blisters and opens, develops yellow-brown crust or bleeds. You develop a fever or chills. If the pain increases or becomes unbearable.  Are unable to keep fluids and food down.  MAKE SURE YOU   Understand these instructions. Will watch your condition. Will get help right away if you are not doing well or get worse.  Thank you for choosing an e-visit.  Your e-visit answers were reviewed by a board certified advanced clinical practitioner to complete your personal care plan. Depending upon the condition, your plan could have included both over the counter or prescription medications.  Please review your pharmacy choice. Make sure the pharmacy is open so you  can pick up prescription now. If there is a problem, you may contact your provider through [employer] and have the prescription routed to another pharmacy.  Your safety is important to [place]. If you have drug allergies check your prescription carefully.   For the next 24 hours you can use MyChart to ask questions about today's visit, request a non-urgent call back, or ask for a work or school excuse. You will get an email in the next two days asking about your experience. I hope that your e-visit has been valuable and will speed your recovery.

## 2021-10-19 NOTE — Progress Notes (Signed)
I have spent 5 minutes in review of e-visit questionnaire, review and updating patient chart, medical decision making and response to patient.   Tawnie Ehresman Cody Galo Sayed, PA-C    

## 2021-10-28 DIAGNOSIS — R451 Restlessness and agitation: Secondary | ICD-10-CM | POA: Diagnosis not present

## 2021-10-28 DIAGNOSIS — E1100 Type 2 diabetes mellitus with hyperosmolarity without nonketotic hyperglycemic-hyperosmolar coma (NKHHC): Secondary | ICD-10-CM | POA: Diagnosis not present

## 2021-10-28 DIAGNOSIS — G3183 Dementia with Lewy bodies: Secondary | ICD-10-CM | POA: Diagnosis not present

## 2021-10-28 DIAGNOSIS — C61 Malignant neoplasm of prostate: Secondary | ICD-10-CM | POA: Diagnosis not present

## 2022-02-03 DIAGNOSIS — F02818 Dementia in other diseases classified elsewhere, unspecified severity, with other behavioral disturbance: Secondary | ICD-10-CM | POA: Diagnosis not present

## 2022-02-03 DIAGNOSIS — E1100 Type 2 diabetes mellitus with hyperosmolarity without nonketotic hyperglycemic-hyperosmolar coma (NKHHC): Secondary | ICD-10-CM | POA: Diagnosis not present

## 2022-02-03 DIAGNOSIS — F321 Major depressive disorder, single episode, moderate: Secondary | ICD-10-CM | POA: Diagnosis not present

## 2022-02-03 DIAGNOSIS — I10 Essential (primary) hypertension: Secondary | ICD-10-CM | POA: Diagnosis not present

## 2022-02-03 DIAGNOSIS — G3183 Dementia with Lewy bodies: Secondary | ICD-10-CM | POA: Diagnosis not present

## 2022-03-29 DIAGNOSIS — R413 Other amnesia: Secondary | ICD-10-CM | POA: Diagnosis not present

## 2022-03-29 DIAGNOSIS — R441 Visual hallucinations: Secondary | ICD-10-CM | POA: Diagnosis not present

## 2022-03-29 DIAGNOSIS — R251 Tremor, unspecified: Secondary | ICD-10-CM | POA: Diagnosis not present

## 2022-03-29 DIAGNOSIS — G4733 Obstructive sleep apnea (adult) (pediatric): Secondary | ICD-10-CM | POA: Diagnosis not present

## 2022-03-29 DIAGNOSIS — E538 Deficiency of other specified B group vitamins: Secondary | ICD-10-CM | POA: Diagnosis not present

## 2022-03-29 DIAGNOSIS — R200 Anesthesia of skin: Secondary | ICD-10-CM | POA: Diagnosis not present

## 2022-03-29 DIAGNOSIS — R2681 Unsteadiness on feet: Secondary | ICD-10-CM | POA: Diagnosis not present

## 2022-03-29 DIAGNOSIS — R440 Auditory hallucinations: Secondary | ICD-10-CM | POA: Diagnosis not present

## 2022-03-29 DIAGNOSIS — I679 Cerebrovascular disease, unspecified: Secondary | ICD-10-CM | POA: Diagnosis not present

## 2022-03-29 DIAGNOSIS — R519 Headache, unspecified: Secondary | ICD-10-CM | POA: Diagnosis not present

## 2022-05-04 DIAGNOSIS — E1100 Type 2 diabetes mellitus with hyperosmolarity without nonketotic hyperglycemic-hyperosmolar coma (NKHHC): Secondary | ICD-10-CM | POA: Diagnosis not present

## 2022-05-04 DIAGNOSIS — R413 Other amnesia: Secondary | ICD-10-CM | POA: Diagnosis not present

## 2022-05-04 DIAGNOSIS — Z125 Encounter for screening for malignant neoplasm of prostate: Secondary | ICD-10-CM | POA: Diagnosis not present

## 2022-05-10 DIAGNOSIS — E1100 Type 2 diabetes mellitus with hyperosmolarity without nonketotic hyperglycemic-hyperosmolar coma (NKHHC): Secondary | ICD-10-CM | POA: Diagnosis not present

## 2022-05-10 DIAGNOSIS — G3183 Dementia with Lewy bodies: Secondary | ICD-10-CM | POA: Diagnosis not present

## 2022-05-10 DIAGNOSIS — R519 Headache, unspecified: Secondary | ICD-10-CM | POA: Diagnosis not present

## 2022-05-10 DIAGNOSIS — F02818 Dementia in other diseases classified elsewhere, unspecified severity, with other behavioral disturbance: Secondary | ICD-10-CM | POA: Diagnosis not present

## 2022-05-10 DIAGNOSIS — I10 Essential (primary) hypertension: Secondary | ICD-10-CM | POA: Diagnosis not present

## 2022-05-11 DIAGNOSIS — G4733 Obstructive sleep apnea (adult) (pediatric): Secondary | ICD-10-CM | POA: Diagnosis not present

## 2022-05-11 DIAGNOSIS — R519 Headache, unspecified: Secondary | ICD-10-CM | POA: Diagnosis not present

## 2022-05-11 DIAGNOSIS — R440 Auditory hallucinations: Secondary | ICD-10-CM | POA: Diagnosis not present

## 2022-05-11 DIAGNOSIS — R441 Visual hallucinations: Secondary | ICD-10-CM | POA: Diagnosis not present

## 2022-05-11 DIAGNOSIS — R413 Other amnesia: Secondary | ICD-10-CM | POA: Diagnosis not present

## 2022-05-11 DIAGNOSIS — Z79899 Other long term (current) drug therapy: Secondary | ICD-10-CM | POA: Diagnosis not present

## 2022-05-11 DIAGNOSIS — Z820 Family history of epilepsy and other diseases of the nervous system: Secondary | ICD-10-CM | POA: Diagnosis not present

## 2022-08-03 DIAGNOSIS — R519 Headache, unspecified: Secondary | ICD-10-CM | POA: Diagnosis not present

## 2022-08-03 DIAGNOSIS — Z79899 Other long term (current) drug therapy: Secondary | ICD-10-CM | POA: Diagnosis not present

## 2022-08-03 DIAGNOSIS — R413 Other amnesia: Secondary | ICD-10-CM | POA: Diagnosis not present

## 2022-08-15 DIAGNOSIS — E1100 Type 2 diabetes mellitus with hyperosmolarity without nonketotic hyperglycemic-hyperosmolar coma (NKHHC): Secondary | ICD-10-CM | POA: Diagnosis not present

## 2022-08-15 DIAGNOSIS — I480 Paroxysmal atrial fibrillation: Secondary | ICD-10-CM | POA: Diagnosis not present

## 2022-10-24 DIAGNOSIS — H524 Presbyopia: Secondary | ICD-10-CM | POA: Diagnosis not present

## 2022-10-24 DIAGNOSIS — Z0100 Encounter for examination of eyes and vision without abnormal findings: Secondary | ICD-10-CM | POA: Diagnosis not present

## 2022-11-01 DIAGNOSIS — E1100 Type 2 diabetes mellitus with hyperosmolarity without nonketotic hyperglycemic-hyperosmolar coma (NKHHC): Secondary | ICD-10-CM | POA: Diagnosis not present

## 2022-11-01 DIAGNOSIS — Z794 Long term (current) use of insulin: Secondary | ICD-10-CM | POA: Diagnosis not present

## 2022-11-01 DIAGNOSIS — Z23 Encounter for immunization: Secondary | ICD-10-CM | POA: Diagnosis not present

## 2022-11-01 DIAGNOSIS — N401 Enlarged prostate with lower urinary tract symptoms: Secondary | ICD-10-CM | POA: Diagnosis not present

## 2022-11-01 DIAGNOSIS — F321 Major depressive disorder, single episode, moderate: Secondary | ICD-10-CM | POA: Diagnosis not present

## 2022-11-01 DIAGNOSIS — I10 Essential (primary) hypertension: Secondary | ICD-10-CM | POA: Diagnosis not present

## 2022-11-03 DIAGNOSIS — Z820 Family history of epilepsy and other diseases of the nervous system: Secondary | ICD-10-CM | POA: Diagnosis not present

## 2022-11-03 DIAGNOSIS — G4730 Sleep apnea, unspecified: Secondary | ICD-10-CM | POA: Diagnosis not present

## 2022-11-03 DIAGNOSIS — R2681 Unsteadiness on feet: Secondary | ICD-10-CM | POA: Diagnosis not present

## 2022-11-03 DIAGNOSIS — R251 Tremor, unspecified: Secondary | ICD-10-CM | POA: Diagnosis not present

## 2022-11-03 DIAGNOSIS — Z79899 Other long term (current) drug therapy: Secondary | ICD-10-CM | POA: Diagnosis not present

## 2022-11-03 DIAGNOSIS — R519 Headache, unspecified: Secondary | ICD-10-CM | POA: Diagnosis not present

## 2022-11-03 DIAGNOSIS — R413 Other amnesia: Secondary | ICD-10-CM | POA: Diagnosis not present

## 2022-11-03 DIAGNOSIS — R200 Anesthesia of skin: Secondary | ICD-10-CM | POA: Diagnosis not present

## 2023-02-13 DIAGNOSIS — C61 Malignant neoplasm of prostate: Secondary | ICD-10-CM | POA: Diagnosis not present

## 2023-02-13 DIAGNOSIS — I480 Paroxysmal atrial fibrillation: Secondary | ICD-10-CM | POA: Diagnosis not present

## 2023-02-13 DIAGNOSIS — F321 Major depressive disorder, single episode, moderate: Secondary | ICD-10-CM | POA: Diagnosis not present

## 2023-02-13 DIAGNOSIS — E1100 Type 2 diabetes mellitus with hyperosmolarity without nonketotic hyperglycemic-hyperosmolar coma (NKHHC): Secondary | ICD-10-CM | POA: Diagnosis not present

## 2023-02-13 DIAGNOSIS — M069 Rheumatoid arthritis, unspecified: Secondary | ICD-10-CM | POA: Diagnosis not present

## 2023-02-13 DIAGNOSIS — Z794 Long term (current) use of insulin: Secondary | ICD-10-CM | POA: Diagnosis not present

## 2023-02-13 DIAGNOSIS — G3183 Dementia with Lewy bodies: Secondary | ICD-10-CM | POA: Diagnosis not present

## 2023-02-13 DIAGNOSIS — F02818 Dementia in other diseases classified elsewhere, unspecified severity, with other behavioral disturbance: Secondary | ICD-10-CM | POA: Diagnosis not present

## 2023-02-13 DIAGNOSIS — I10 Essential (primary) hypertension: Secondary | ICD-10-CM | POA: Diagnosis not present

## 2023-03-27 DIAGNOSIS — E1100 Type 2 diabetes mellitus with hyperosmolarity without nonketotic hyperglycemic-hyperosmolar coma (NKHHC): Secondary | ICD-10-CM | POA: Diagnosis not present

## 2023-03-27 DIAGNOSIS — I10 Essential (primary) hypertension: Secondary | ICD-10-CM | POA: Diagnosis not present

## 2023-03-28 DIAGNOSIS — Z6841 Body Mass Index (BMI) 40.0 and over, adult: Secondary | ICD-10-CM | POA: Diagnosis not present

## 2023-03-28 DIAGNOSIS — Z23 Encounter for immunization: Secondary | ICD-10-CM | POA: Diagnosis not present

## 2023-03-28 DIAGNOSIS — I10 Essential (primary) hypertension: Secondary | ICD-10-CM | POA: Diagnosis not present

## 2023-03-28 DIAGNOSIS — E1100 Type 2 diabetes mellitus with hyperosmolarity without nonketotic hyperglycemic-hyperosmolar coma (NKHHC): Secondary | ICD-10-CM | POA: Diagnosis not present

## 2023-03-28 DIAGNOSIS — Z0000 Encounter for general adult medical examination without abnormal findings: Secondary | ICD-10-CM | POA: Diagnosis not present

## 2023-03-28 DIAGNOSIS — Z1211 Encounter for screening for malignant neoplasm of colon: Secondary | ICD-10-CM | POA: Diagnosis not present

## 2023-05-09 DIAGNOSIS — G3183 Dementia with Lewy bodies: Secondary | ICD-10-CM | POA: Diagnosis not present

## 2023-05-09 DIAGNOSIS — R2681 Unsteadiness on feet: Secondary | ICD-10-CM | POA: Diagnosis not present

## 2023-05-09 DIAGNOSIS — F02818 Dementia in other diseases classified elsewhere, unspecified severity, with other behavioral disturbance: Secondary | ICD-10-CM | POA: Diagnosis not present

## 2023-05-09 DIAGNOSIS — R413 Other amnesia: Secondary | ICD-10-CM | POA: Diagnosis not present

## 2023-05-11 DIAGNOSIS — E119 Type 2 diabetes mellitus without complications: Secondary | ICD-10-CM | POA: Diagnosis not present

## 2023-05-11 DIAGNOSIS — Z794 Long term (current) use of insulin: Secondary | ICD-10-CM | POA: Diagnosis not present

## 2023-07-12 DIAGNOSIS — E119 Type 2 diabetes mellitus without complications: Secondary | ICD-10-CM | POA: Diagnosis not present

## 2023-07-12 DIAGNOSIS — Z794 Long term (current) use of insulin: Secondary | ICD-10-CM | POA: Diagnosis not present

## 2023-07-12 DIAGNOSIS — Z8546 Personal history of malignant neoplasm of prostate: Secondary | ICD-10-CM | POA: Diagnosis not present

## 2023-08-10 DIAGNOSIS — Z0100 Encounter for examination of eyes and vision without abnormal findings: Secondary | ICD-10-CM | POA: Diagnosis not present

## 2023-10-26 DIAGNOSIS — Z8546 Personal history of malignant neoplasm of prostate: Secondary | ICD-10-CM | POA: Diagnosis not present

## 2023-10-26 DIAGNOSIS — N5201 Erectile dysfunction due to arterial insufficiency: Secondary | ICD-10-CM | POA: Diagnosis not present

## 2023-10-26 DIAGNOSIS — Z1330 Encounter for screening examination for mental health and behavioral disorders, unspecified: Secondary | ICD-10-CM | POA: Diagnosis not present

## 2023-11-27 DIAGNOSIS — F321 Major depressive disorder, single episode, moderate: Secondary | ICD-10-CM | POA: Diagnosis not present

## 2023-11-27 DIAGNOSIS — I10 Essential (primary) hypertension: Secondary | ICD-10-CM | POA: Diagnosis not present

## 2023-11-27 DIAGNOSIS — E119 Type 2 diabetes mellitus without complications: Secondary | ICD-10-CM | POA: Diagnosis not present

## 2023-11-27 DIAGNOSIS — Z794 Long term (current) use of insulin: Secondary | ICD-10-CM | POA: Diagnosis not present

## 2023-12-25 ENCOUNTER — Telehealth: Payer: Self-pay | Admitting: Physician Assistant

## 2023-12-25 DIAGNOSIS — L0390 Cellulitis, unspecified: Secondary | ICD-10-CM

## 2023-12-25 DIAGNOSIS — W540XXA Bitten by dog, initial encounter: Secondary | ICD-10-CM

## 2023-12-25 MED ORDER — AMOXICILLIN-POT CLAVULANATE 875-125 MG PO TABS: 1.0000 | ORAL_TABLET | Freq: Two times a day (BID) | ORAL | 0 refills | Status: DC

## 2023-12-25 NOTE — Progress Notes (Signed)
 E-Visit for Simple Cut/Laceration  We are sorry that you have had an injury. Here is how we plan to help!  I have prescribed Augmentin  875-125 mg by mouth twice daily for seven days  Would you please reply with a photo of the injury to this message so we can have one on file for you in case this does progress at all.   Consider updating Tetanus vaccine (Tdap) if older than 68 years old. Can contact PCP for updating this. You can also contact your local pharmacy, some pharmacies will offer vaccinations.   HOME CARE: Clean the cut or scrape - Wash it well with soap and water. * avoid using hydrogen peroxide which may cause tissue damage, or impede wound healing.  Stop the bleeding - If your cut or scrape is bleeding, press a clean cloth or bandage firmly on the area for 20 minutes. You can also help slow the bleeding by holding the cut above the level of your heart.   Put a thin layer of Bacitracin antibiotic ointment on the cut or scrape. (this can be purchased at any local pharmacy- ask your pharmacist if you need assistance)   Cover the cut or scrape with a bandage or gauze. Keep the bandage clean and dry. Change the bandage 1 to 2 times every day until your cut or scrape heals.   Watch for signs that your cut or scrape is infected (redness, drainage, pain, warmth, swelling or fever)  Over the next 48 hours your wound should start to improve with less pain, less swelling and less redness. If you should develop increasing pain, swelling, redness, fever, pus from the wound you should be seen immediately to make sure this is not becoming infected.   WOUND CARE: Please keep a layer of antibiotic ointment (bacitracin preferred) on this wound at least twice a day for the next seven days and keep a sterile dressing over top of it. You may gently clean the wound with warm soap and water between dressing changes.  We strongly recommend that you have a medical provider reevaluate your wound within 2  to 3 days in person to make sure that it is healing appropriately.  Thank you for choosing an e-visit.  Your e-visit answers were reviewed by a board certified advanced clinical practitioner to complete your personal care plan. Depending upon the condition, your plan could have included both over the counter or prescription medications.  Please review your pharmacy choice. Make sure the pharmacy is open so you can pick up prescription now. If there is a problem, you may contact your provider through [employer] and have the prescription routed to another pharmacy.  Your safety is important to us . If you have drug allergies check your prescription carefully.   For the next 24 hours you can use MyChart to ask questions about today's visit, request a non-urgent call back, or ask for a work or school excuse. You will get an email in the next two days asking about your experience. I hope that your e-visit has been valuable and will speed your recovery.    I have spent 5 minutes in review of e-visit questionnaire, review and updating patient chart, medical decision making and response to patient.   Brandon [name] Dickinson, PA-C

## 2024-04-15 ENCOUNTER — Telehealth: Payer: Self-pay | Admitting: Physician Assistant

## 2024-04-15 DIAGNOSIS — W540XXA Bitten by dog, initial encounter: Secondary | ICD-10-CM

## 2024-04-15 DIAGNOSIS — S61411A Laceration without foreign body of right hand, initial encounter: Secondary | ICD-10-CM

## 2024-04-15 DIAGNOSIS — L0390 Cellulitis, unspecified: Secondary | ICD-10-CM

## 2024-04-15 MED ORDER — AMOXICILLIN-POT CLAVULANATE 875-125 MG PO TABS: 1.0000 | ORAL_TABLET | Freq: Two times a day (BID) | ORAL | 0 refills | Status: AC

## 2024-04-15 NOTE — Progress Notes (Signed)
 E-Visit for Simple Cut/Laceration  We are sorry that you have had an injury. Here is how we plan to help!  Based on what you shared with me it looks like you have a simple laceration that does not need to be repaired with stitches or tissue glue.  and I have prescribed Augmentin 875-125 mg by mouth twice daily for seven days  HOME CARE: Clean the cut or scrape - Wash it well with soap and water. * avoid using hydrogen peroxide which may cause tissue damage, or impede wound healing.  Stop the bleeding - If your cut or scrape is bleeding, press a clean cloth or bandage firmly on the area for 20 minutes. You can also help slow the bleeding by holding the cut above the level of your heart.   Put a thin layer of Bacitracin antibiotic ointment on the cut or scrape. (this can be purchased at any local pharmacy- ask your pharmacist if you need assistance)   Cover the cut or scrape with a bandage or gauze. Keep the bandage clean and dry. Change the bandage 1 to 2 times every day until your cut or scrape heals.   Watch for signs that your cut or scrape is infected (redness, drainage, pain, warmth, swelling or fever)  Over the next 48 hours your wound should start to improve with less pain, less swelling and less redness. If you should develop increasing pain, swelling, redness, fever, pus from the wound you should be seen immediately to make sure this is not becoming infected.   WOUND CARE: Please keep a layer of antibiotic ointment (bacitracin preferred) on this wound at least twice a day for the next seven days and keep a sterile dressing over top of it. You may gently clean the wound with warm soap and water between dressing changes.  We strongly recommend that you have a medical provider reevaluate your wound within 2 to 3 days in person to make sure that it is healing appropriately.  Thank you for choosing an e-visit.  Your e-visit answers were reviewed by a board certified advanced clinical  practitioner to complete your personal care plan. Depending upon the condition, your plan could have included both over the counter or prescription medications.  Please review your pharmacy choice. Make sure the pharmacy is open so you can pick up prescription now. If there is a problem, you may contact your provider through [employer] and have the prescription routed to another pharmacy.  Your safety is important to [place]. If you have drug allergies check your prescription carefully.   For the next 24 hours you can use MyChart to ask questions about today's visit, request a non-urgent call back, or ask for a work or school excuse. You will get an email in the next two days asking about your experience. I hope that your e-visit has been valuable and will speed your recovery.   I have spent 5 minutes in review of e-visit questionnaire, review and updating patient chart, medical decision making and response to patient.   Margaretann Loveless, PA-C

## 2024-10-08 DIAGNOSIS — Z7984 Long term (current) use of oral hypoglycemic drugs: Secondary | ICD-10-CM | POA: Diagnosis not present

## 2024-10-08 DIAGNOSIS — M542 Cervicalgia: Secondary | ICD-10-CM | POA: Diagnosis not present

## 2024-10-08 DIAGNOSIS — S0990XA Unspecified injury of head, initial encounter: Secondary | ICD-10-CM | POA: Diagnosis not present

## 2024-10-08 DIAGNOSIS — H538 Other visual disturbances: Secondary | ICD-10-CM | POA: Diagnosis not present

## 2024-10-08 DIAGNOSIS — W19XXXA Unspecified fall, initial encounter: Secondary | ICD-10-CM | POA: Diagnosis not present

## 2024-10-08 DIAGNOSIS — R200 Anesthesia of skin: Secondary | ICD-10-CM | POA: Diagnosis not present

## 2024-10-08 DIAGNOSIS — Z794 Long term (current) use of insulin: Secondary | ICD-10-CM | POA: Diagnosis not present

## 2024-10-08 DIAGNOSIS — Z79899 Other long term (current) drug therapy: Secondary | ICD-10-CM | POA: Diagnosis not present

## 2024-10-08 DIAGNOSIS — R519 Headache, unspecified: Secondary | ICD-10-CM | POA: Diagnosis not present

## 2024-10-08 DIAGNOSIS — G4489 Other headache syndrome: Secondary | ICD-10-CM | POA: Diagnosis not present

## 2024-10-09 DIAGNOSIS — R519 Headache, unspecified: Secondary | ICD-10-CM | POA: Diagnosis not present

## 2024-10-09 DIAGNOSIS — R200 Anesthesia of skin: Secondary | ICD-10-CM | POA: Diagnosis not present

## 2025-01-13 NOTE — Progress Notes (Signed)
 Brandon [name]                                          MRN: [id number]   01/13/2025   The [facility] Quality Team Specialist reviewed this patient medical record for the purposes of chart review for care gap closure. The following were reviewed: chart review for care gap closure-glycemic status assessment.    [facility]I Quality Team

## 2025-01-14 NOTE — Progress Notes (Signed)
 KERMAN PFOST                                          MRN: [id number]   01/14/2025   The [facility]BCI Quality Team Specialist reviewed this patient medical record for the purposes of chart review for care gap closure. The following were reviewed: chart review for care gap closure-controlling blood pressure.    [facility]BCI Quality Team

## 2025-01-24 ENCOUNTER — Encounter: Payer: Self-pay | Admitting: *Deleted

## 2025-01-24 NOTE — Progress Notes (Signed)
 ELWOOD BAZINET                                          MRN: [id number]   01/24/2025   The [facility] Quality Team Specialist reviewed this patient medical record for the purposes of chart review for care gap closure. The following were reviewed: chart review for care gap closure-diabetic eye exam.    [facility]BCI Quality Team
